# Patient Record
Sex: Male | Born: 1960 | Hispanic: Yes | Marital: Married | Smoking: Never smoker
Health system: Southern US, Community
[De-identification: ages and names within clinical notes are randomized; demographics above are authoritative.]

## PROBLEM LIST (undated history)

## (undated) ENCOUNTER — Ambulatory Visit (HOSPITAL_COMMUNITY): Admission: EM | Payer: BC Managed Care – PPO | Source: Home / Self Care

## (undated) DIAGNOSIS — E785 Hyperlipidemia, unspecified: Secondary | ICD-10-CM

## (undated) DIAGNOSIS — E119 Type 2 diabetes mellitus without complications: Secondary | ICD-10-CM

## (undated) DIAGNOSIS — I10 Essential (primary) hypertension: Secondary | ICD-10-CM

## (undated) DIAGNOSIS — L409 Psoriasis, unspecified: Secondary | ICD-10-CM

## (undated) HISTORY — PX: KNEE SURGERY: SHX244

---

## 2004-07-30 ENCOUNTER — Emergency Department (HOSPITAL_COMMUNITY): Admission: EM | Admit: 2004-07-30 | Discharge: 2004-07-30 | Payer: Self-pay | Admitting: Emergency Medicine

## 2004-09-25 ENCOUNTER — Emergency Department (HOSPITAL_COMMUNITY): Admission: EM | Admit: 2004-09-25 | Discharge: 2004-09-25 | Payer: Self-pay | Admitting: Emergency Medicine

## 2010-12-06 ENCOUNTER — Other Ambulatory Visit (HOSPITAL_COMMUNITY): Payer: Self-pay | Admitting: *Deleted

## 2010-12-06 ENCOUNTER — Emergency Department (HOSPITAL_COMMUNITY): Payer: Self-pay

## 2010-12-06 ENCOUNTER — Emergency Department (HOSPITAL_COMMUNITY)
Admission: EM | Admit: 2010-12-06 | Discharge: 2010-12-06 | Disposition: A | Discharge status: Home / Self Care | Payer: Self-pay | Attending: Emergency Medicine | Admitting: Emergency Medicine

## 2010-12-06 DIAGNOSIS — J029 Acute pharyngitis, unspecified: Secondary | ICD-10-CM | POA: Insufficient documentation

## 2010-12-06 DIAGNOSIS — R05 Cough: Secondary | ICD-10-CM | POA: Insufficient documentation

## 2010-12-06 DIAGNOSIS — J069 Acute upper respiratory infection, unspecified: Secondary | ICD-10-CM | POA: Insufficient documentation

## 2010-12-06 DIAGNOSIS — R059 Cough, unspecified: Secondary | ICD-10-CM | POA: Insufficient documentation

## 2012-05-31 IMAGING — CR DG CHEST 2V
2 series · 2 of 2 positions shown · non-contrast
Comparison: 07/30/2004

CLINICAL DATA: Cough

CHEST - 2 VIEW

[view not recorded (1 of 2)]
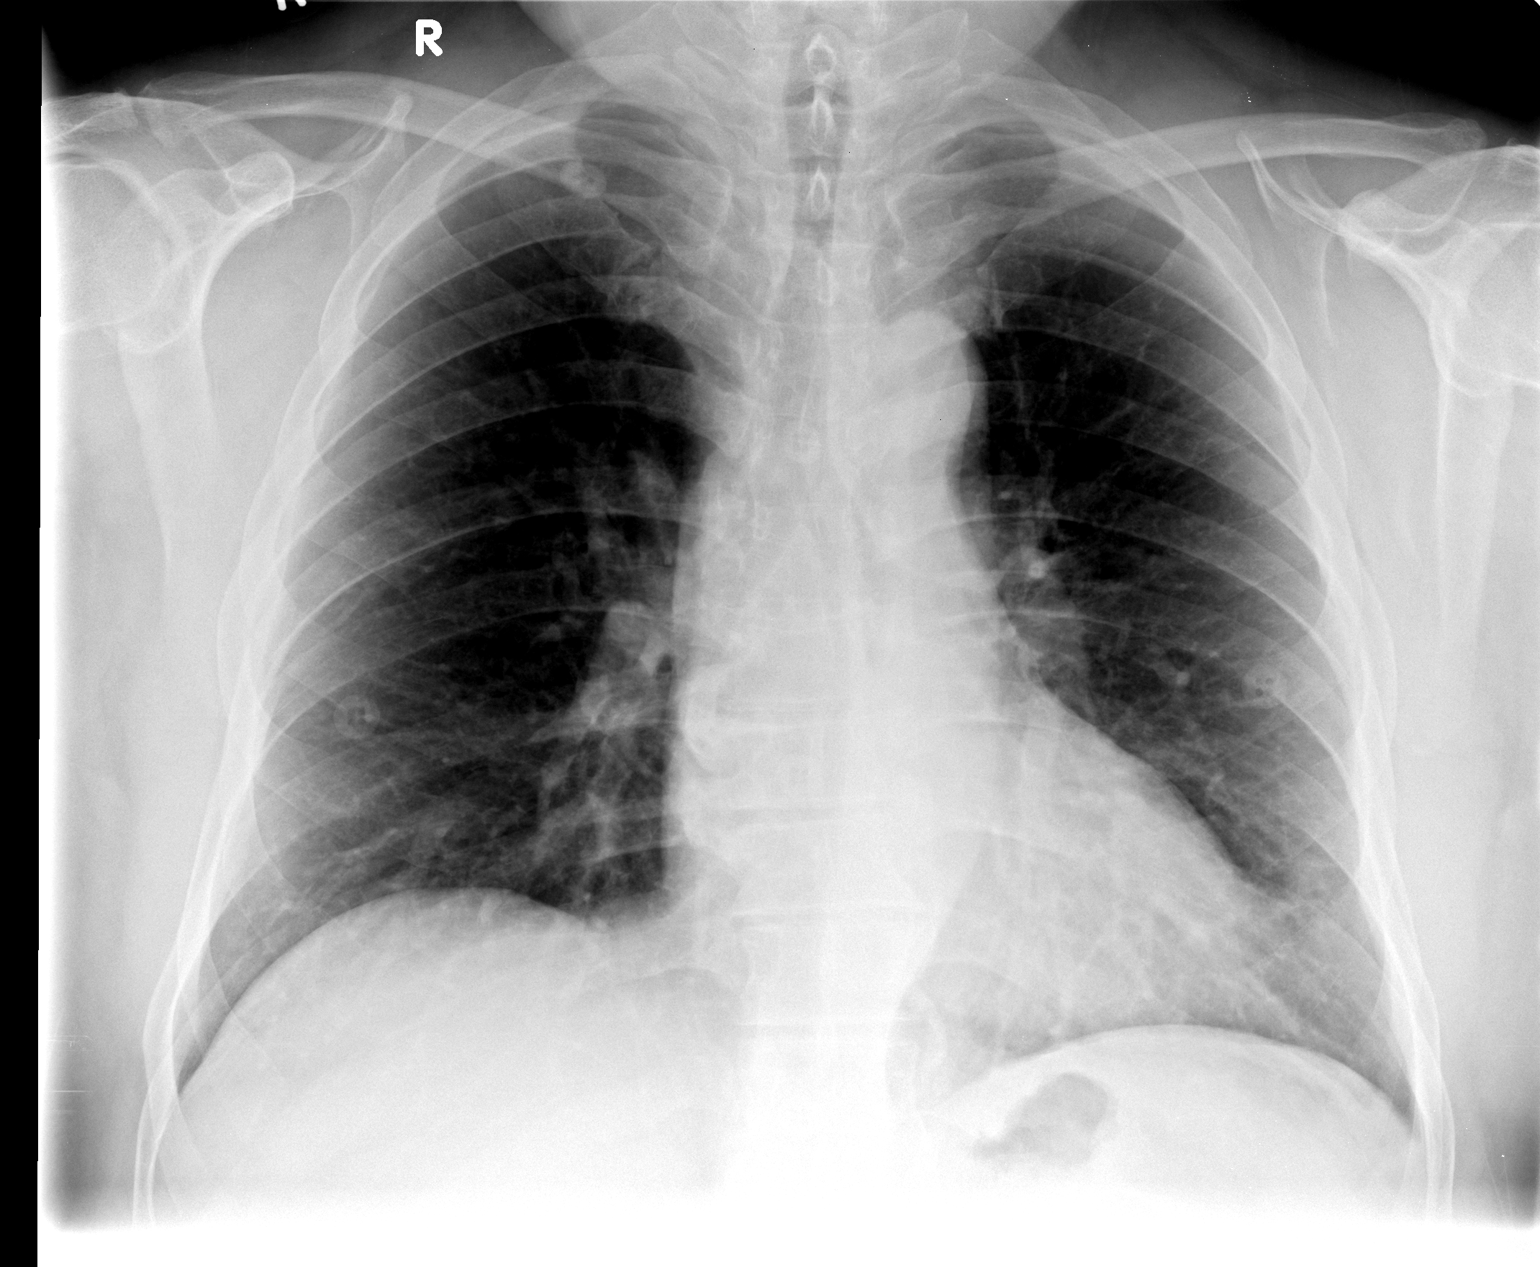

[view not recorded (2 of 2)]
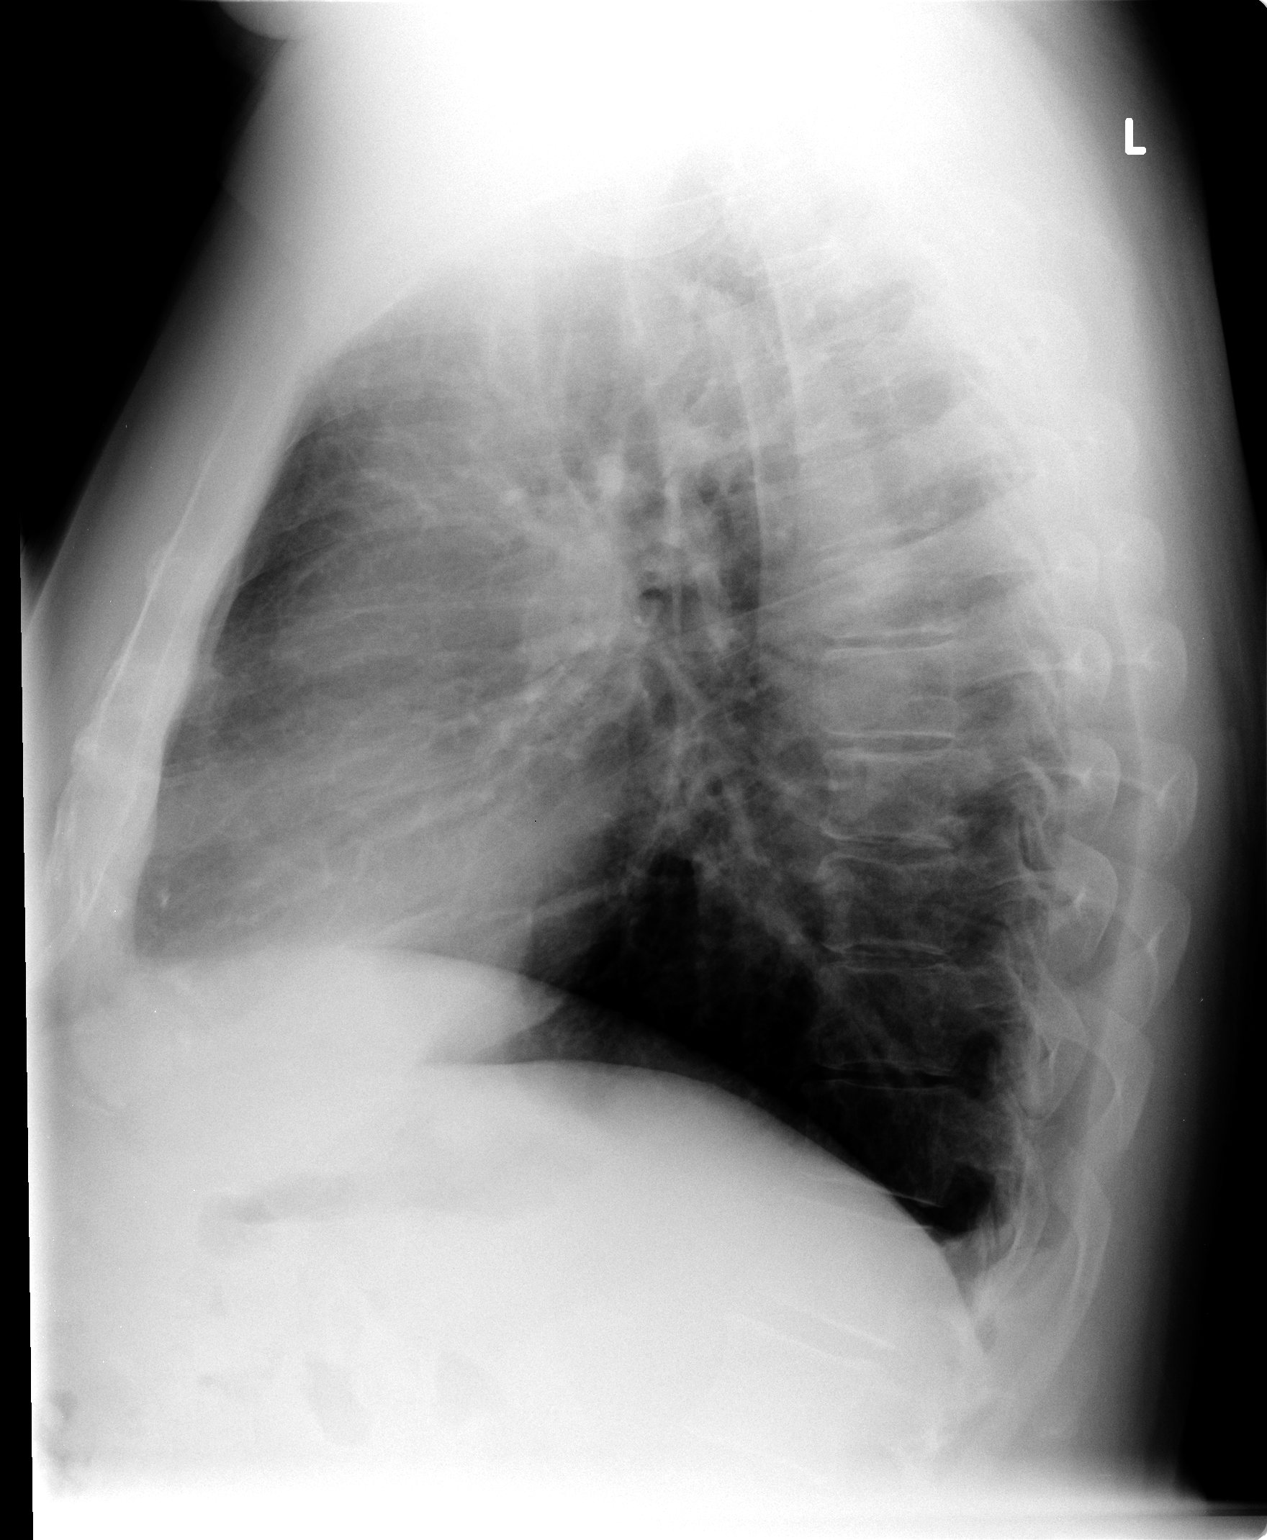

[2 of 2 positions shown; findings below may reference images not displayed]

FINDINGS: Heart size normal.  Aorta mildly tortuous.  Lungs
hyperaerated but clear.

Bilateral button artifacts project over the chest on the frontal
view.
IMPRESSION: No active disease.

## 2022-02-28 ENCOUNTER — Other Ambulatory Visit: Payer: Self-pay

## 2022-02-28 ENCOUNTER — Encounter (HOSPITAL_COMMUNITY): Payer: Self-pay | Admitting: Emergency Medicine

## 2022-02-28 ENCOUNTER — Emergency Department (HOSPITAL_COMMUNITY)
Admission: EM | Admit: 2022-02-28 | Discharge: 2022-02-28 | Disposition: A | Discharge status: Home / Self Care | Payer: BC Managed Care – PPO | Attending: Emergency Medicine | Admitting: Emergency Medicine

## 2022-02-28 DIAGNOSIS — R21 Rash and other nonspecific skin eruption: Secondary | ICD-10-CM | POA: Diagnosis not present

## 2022-02-28 DIAGNOSIS — E1165 Type 2 diabetes mellitus with hyperglycemia: Secondary | ICD-10-CM | POA: Diagnosis not present

## 2022-02-28 HISTORY — DX: Essential (primary) hypertension: I10

## 2022-02-28 HISTORY — DX: Hyperlipidemia, unspecified: E78.5

## 2022-02-28 LAB — CBC WITH DIFFERENTIAL/PLATELET
Abs Immature Granulocytes: 0.02 10*3/uL (ref 0.00–0.07)
Basophils Absolute: 0 10*3/uL (ref 0.0–0.1)
Basophils Relative: 1 %
Eosinophils Absolute: 0.2 10*3/uL (ref 0.0–0.5)
Eosinophils Relative: 2 %
HCT: 40.3 % (ref 39.0–52.0)
Hemoglobin: 14.1 g/dL (ref 13.0–17.0)
Immature Granulocytes: 0 %
Lymphocytes Relative: 20 %
Lymphs Abs: 1.3 10*3/uL (ref 0.7–4.0)
MCH: 32.1 pg (ref 26.0–34.0)
MCHC: 35 g/dL (ref 30.0–36.0)
MCV: 91.8 fL (ref 80.0–100.0)
Monocytes Absolute: 0.5 10*3/uL (ref 0.1–1.0)
Monocytes Relative: 7 %
Neutro Abs: 4.6 10*3/uL (ref 1.7–7.7)
Neutrophils Relative %: 70 %
Platelets: 271 10*3/uL (ref 150–400)
RBC: 4.39 MIL/uL (ref 4.22–5.81)
RDW: 12.4 % (ref 11.5–15.5)
WBC: 6.5 10*3/uL (ref 4.0–10.5)
nRBC: 0 % (ref 0.0–0.2)

## 2022-02-28 LAB — BASIC METABOLIC PANEL
Anion gap: 9 (ref 5–15)
BUN: 12 mg/dL (ref 6–20)
CO2: 28 mmol/L (ref 22–32)
Calcium: 9.1 mg/dL (ref 8.9–10.3)
Chloride: 98 mmol/L (ref 98–111)
Creatinine, Ser: 0.91 mg/dL (ref 0.61–1.24)
GFR, Estimated: >60 mL/min (ref >60)
Glucose, Bld: 144 mg/dL — ABNORMAL HIGH (ref 70–99)
Potassium: 3.7 mmol/L (ref 3.5–5.1)
Sodium: 135 mmol/L (ref 135–145)

## 2022-02-28 LAB — HIV ANTIBODY (ROUTINE TESTING W REFLEX): HIV Screen 4th Generation wRfx: NONREACTIVE

## 2022-02-28 MED ORDER — PREDNISONE 10 MG PO TABS: 30.0000 mg | ORAL_TABLET | Freq: Every day | ORAL | 0 refills | Status: AC

## 2022-02-28 MED ORDER — DIPHENHYDRAMINE HCL 25 MG PO CAPS
50.0000 mg | ORAL_CAPSULE | Freq: Once | ORAL | Status: AC
Start: 2022-02-28 — End: 2022-02-28
  Administered 2022-02-28: 50 mg via ORAL
  Filled 2022-02-28: qty 2

## 2022-02-28 MED ORDER — FAMOTIDINE 20 MG PO TABS
20.0000 mg | ORAL_TABLET | Freq: Once | ORAL | Status: AC
  Administered 2022-02-28: 20 mg via ORAL
  Filled 2022-02-28: qty 1

## 2022-02-28 MED ORDER — DOXYCYCLINE HYCLATE 100 MG PO CAPS: 100.0000 mg | ORAL_CAPSULE | Freq: Two times a day (BID) | ORAL | 0 refills | Status: AC

## 2022-02-28 NOTE — Discharge Instructions (Signed)
I have started you on antibiotics and steroids please take as prescribed.  Please follow-up with dermatology.  Come back to the emergency department if you develop chest pain, shortness of breath, severe abdominal pain, uncontrolled nausea, vomiting, diarrhea.

## 2022-02-28 NOTE — ED Provider Notes (Signed)
[facility] Provider Note   CSN: [id number] Arrival date & time: 02/28/22  1014     History  Chief Complaint  Patient presents with   Rash    Jose Summers is a 61 y.o. male.  HPI  With medical history including diabetes, presents with complaints of a rash.  Patient's rash started about 2 months ago, states initially was under his right armpit is in migrated all over his body, states the rash is itchy, will occasion become painful when he sweats in it, he denies any tongue throat lip swelling difficulty breathing no nausea or vomiting he denies systemic fevers chills general body aches.  He denies oral lesions or lesions or hands or feet, no penile discharge or testicular pain, denies any recent tick bites.  No new environmental changes or new antibiotics.  He states that he has had this in the past and was given a cream and it all went away.  He does note that the rash seems to become more inflamed and then decrease, typically during the day and then improves by the time he wakes up.  Home Medications Prior to Admission medications   Medication Sig Start Date End Date Taking? Authorizing Provider  doxycycline (VIBRAMYCIN) 100 MG capsule Take 1 capsule (100 mg total) by mouth 2 (two) times daily for 7 days. 02/28/22 03/07/22 Yes Marcello Fennel, PA-C  predniSONE (DELTASONE) 10 MG tablet Take 3 tablets (30 mg total) by mouth daily for 5 days. 02/28/22 03/05/22 Yes Marcello Fennel, PA-C      Allergies    Patient has no known allergies.    Review of Systems   Review of Systems  Constitutional:  Negative for chills and fever.  Respiratory:  Negative for shortness of breath.   Cardiovascular:  Negative for chest pain.  Gastrointestinal:  Negative for abdominal pain.  Skin:  Positive for rash.  Neurological:  Negative for headaches.   Physical Exam Updated Vital Signs BP (!) 157/111 (BP Location: Right Arm)   Pulse 70   Temp 97.9 F (36.6 C) (Oral)    Resp 16   Ht 5\' 6"  (1.676 m)   Wt 127 kg   SpO2 98%   BMI 45.19 kg/m  Physical Exam Vitals and nursing note reviewed.  Constitutional:      General: He is not in acute distress.    Appearance: He is not ill-appearing.  HENT:     Head: Normocephalic and atraumatic.     Nose: No congestion.     Mouth/Throat:     Mouth: Mucous membranes are moist.     Pharynx: Oropharynx is clear. No oropharyngeal exudate or posterior oropharyngeal erythema.     Comments: No oral lesions present.  No exudates or erythema noted around the tonsils. Eyes:     General: No scleral icterus.    Conjunctiva/sclera: Conjunctivae normal.  Cardiovascular:     Rate and Rhythm: Normal rate and regular rhythm.     Pulses: Normal pulses.     Heart sounds: No murmur heard.   No friction rub. No gallop.  Pulmonary:     Effort: No respiratory distress.     Breath sounds: No wheezing, rhonchi or rales.  Skin:    General: Skin is warm and dry.     Comments: Skin exam was performed he has noted papule like rash around his body, noted beneath his armpits on his arms back chest and legs, it spares the palms and the soles of the  feet, rash will blanch with pressure, some of the rash has scaling and scabbing on it.  Please see picture for full detail.  Neurological:     Mental Status: He is alert.  Psychiatric:        Mood and Affect: Mood normal.             ED Results / Procedures / Treatments   Labs (all labs ordered are listed, but only abnormal results are displayed) Labs Reviewed  BASIC METABOLIC PANEL - Abnormal; Notable for the following components:      Result Value   Glucose, Bld 144 (*)    All other components within normal limits  CBC WITH DIFFERENTIAL/PLATELET  RPR  HIV ANTIBODY (ROUTINE TESTING W REFLEX)  GC/CHLAMYDIA PROBE AMP ([place]) NOT AT [facility]    EKG None  Radiology No results found.  Procedures Procedures    Medications Ordered in ED Medications  diphenhydrAMINE  (BENADRYL) capsule 50 mg (50 mg Oral Given 02/28/22 1152)  famotidine (PEPCID) tablet 20 mg (20 mg Oral Given 02/28/22 1152)    ED Course/ Medical Decision Making/ A&P                           Medical Decision Making Amount and/or Complexity of Data Reviewed Labs: ordered.   This patient presents to the ED for concern of rash, this involves an extensive number of treatment options, and is a complaint that carries with it a high risk of complications and morbidity.  The differential diagnosis includes the [place] gonorrhea, HIV, syphilis, Stevens-Johnson's, Lyme's disease    Additional history obtained:  Additional history obtained from wife at bedside External records from outside source obtained and reviewed including N/A   Co morbidities that complicate the patient evaluation  Diabetes  Social Determinants of Health:  No primary care provider    Lab Tests:  I Ordered, and personally interpreted labs.  The pertinent results include: CBC unremarkable, BMP shows glucose of 144, HIV, syphilis GC chlamydia are all pending at this time.   Imaging Studies ordered:  I ordered imaging studies including N/A I independently visualized and interpreted imaging which showed N/A I agree with the radiologist interpretation   Cardiac Monitoring:  The patient was maintained on a cardiac monitor.  I personally viewed and interpreted the cardiac monitored which showed an underlying rhythm of: N/A   Medicines ordered and prescription drug management:  I ordered medication including H1 H2 blockers I have reviewed the patients home medicines and have made adjustments as needed  Critical Interventions:  N/A   Reevaluation:  Presents with a rash, unclear etiology, will obtain basic lab work-up provided prior with some Benadryl and reassess  Reassessed after H1 and H2 blockers rash appears to improved, but is still there, vital signs remained stable, he is agreement with  discharge and plan.  Consultations Obtained:  N/A    considered:  Admission-this will be deferred as this rashes been going on for last 2 months, he is hemodynamically stable, nontoxic-appearing, no significant derailments in lab work.  He has not attempted outpatient therapy will attempt and have him follow-up with dermatology.    Rule out Low suspicion for TENS or Stevens-Johnson's as there is no oral lesions, no skin sloughing.  I have low suspicion for Staphylococcus scalded skin syndrome is no peeling of the skin, patient has not been on any recent antibiotics.  I have low suspicion for Lyme disease [facility] spotted  fever, syphilis as presentation is atypical of etiology spares the soles and palms.  I have low suspicion for hand-foot-and-mouth presentation atypical.  Low suspicion for systemic infection as patient nontoxic-appearing vital signs are reassuring.    Dispostion and problem list  After consideration of the diagnostic results and the patients response to treatment, I feel that the patent would benefit from discharge.  Rash-unclear etiology but I suspect this is multifactorial, I suspect he has some suspicious dermatitis noted on his head, he may be suffering from atypical psoriasis.  I am going to start him a short course of steroids, and also start him on some antibiotics as he has been picking at the skin and concerned he might develop a overlying skin infection.  Will recommend follows up with dermatology.           Final Clinical Impression(s) / ED Diagnoses Final diagnoses:  Rash    Rx / DC Orders ED Discharge Orders          Ordered    predniSONE (DELTASONE) 10 MG tablet  Daily        02/28/22 1333    doxycycline (VIBRAMYCIN) 100 MG capsule  2 times daily        02/28/22 1333              Aron Baba 02/28/22 1333    Godfrey Pick, MD 03/05/22 [phone]

## 2022-02-28 NOTE — ED Triage Notes (Signed)
Pt to the ED with a rash on his entire body for the last 2 months. The rash in read in color and itchy.

## 2022-03-01 LAB — GC/CHLAMYDIA PROBE AMP (~~LOC~~) NOT AT ARMC
Chlamydia: NEGATIVE
Comment: NEGATIVE
Comment: NORMAL
Neisseria Gonorrhea: NEGATIVE

## 2022-03-01 LAB — RPR: RPR Ser Ql: NONREACTIVE

## 2023-07-13 ENCOUNTER — Encounter (HOSPITAL_COMMUNITY): Payer: Self-pay

## 2023-07-13 ENCOUNTER — Ambulatory Visit (HOSPITAL_COMMUNITY)
Admission: RE | Admit: 2023-07-13 | Discharge: 2023-07-13 | Disposition: A | Discharge status: Home / Self Care | Payer: BC Managed Care – PPO | Source: Ambulatory Visit

## 2023-07-13 VITALS — BP 143/90 | HR 77 | Temp 97.7°F | Resp 18 | Ht 66.0 in | Wt 280.0 lb

## 2023-07-13 DIAGNOSIS — L409 Psoriasis, unspecified: Secondary | ICD-10-CM

## 2023-07-13 HISTORY — DX: Psoriasis, unspecified: L40.9

## 2023-07-13 HISTORY — DX: Type 2 diabetes mellitus without complications: E11.9

## 2023-07-13 MED ORDER — PREDNISONE 20 MG PO TABS: 40.0000 mg | ORAL_TABLET | Freq: Every day | ORAL | 0 refills | Status: AC

## 2023-07-13 MED ORDER — HYDROXYZINE HCL 25 MG PO TABS: 25.0000 mg | ORAL_TABLET | Freq: Three times a day (TID) | ORAL | 0 refills | Status: DC | PRN

## 2023-07-13 MED ORDER — DEXAMETHASONE SODIUM PHOSPHATE 10 MG/ML IJ SOLN
INTRAMUSCULAR | Status: AC
  Filled 2023-07-13: qty 1

## 2023-07-13 MED ORDER — DEXAMETHASONE SODIUM PHOSPHATE 10 MG/ML IJ SOLN
10.0000 mg | Freq: Once | INTRAMUSCULAR | Status: AC
  Administered 2023-07-13: 10 mg via INTRAMUSCULAR

## 2023-07-13 NOTE — Discharge Instructions (Addendum)
Start taking prednisone tomorrow daily for 5 days.  Steroids can increase your blood sugar so please keep a close eye on these and try to eat a low-carb diet over the next few days.  You can take hydroxyzine as needed for itching but this can make you drowsy so do not take while driving or working.  Continue using moisturizing cream that you have been using at home.  I have attached a dermatologist by the name of Dr. Langston Reusing, please call them first thing Monday morning to set up an appointment regarding chronic treatment for psoriasis.  Return here as needed.

## 2023-07-13 NOTE — ED Provider Notes (Signed)
[facility]    CSN: [id number] Arrival date & time: 07/13/23  1039      History   Chief Complaint Chief Complaint  Patient presents with   Skin Problem    HPI Jose Summers is a 62 y.o. male.   Patient presents with a rash and skin irritation x 4 months.  History of psoriasis.  Reports using CeraVe and over-the-counter moisturizers with minimal relief.     Past Medical History:  Diagnosis Date   Diabetes mellitus without complication (HCC)    Hyperlipidemia    Hypertension    Psoriasis     There are no problems to display for this patient.   Past Surgical History:  Procedure Laterality Date   KNEE SURGERY Right        Home Medications    Prior to Admission medications   Medication Sig Start Date End Date Taking? Authorizing Provider  atorvastatin (LIPITOR) 40 MG tablet TAKE 1 TABLET EVERY DAY BY MOUTH AT BEDTIME FOR 90 DAYS.   Yes [provider]  hydrOXYzine (ATARAX) 25 MG tablet Take 1 tablet (25 mg total) by mouth every 8 (eight) hours as needed for itching. 07/13/23  Yes Angelik Walls A, NP  lisinopril-hydrochlorothiazide (ZESTORETIC) 20-12.5 MG tablet TAKE 2 TABLETS EVERY DAY BY ORAL ROUTE FOR 90 DAYS.   Yes [provider]  metFORMIN (GLUCOPHAGE) 850 MG tablet TAKE 1 TABLET TWICE A DAY BY MOUTH WITH MEALS FOR 90 DAYS.   Yes [provider]  predniSONE (DELTASONE) 20 MG tablet Take 2 tablets (40 mg total) by mouth daily for 5 days. 07/13/23 07/18/23 Yes Letta Kocher, NP    Family History History reviewed. No pertinent family history.  Social History Social History   Tobacco Use   Smoking status: Never    Passive exposure: Never   Smokeless tobacco: Never  Vaping Use   Vaping status: Never Used  Substance Use Topics   Alcohol use: Not Currently   Drug use: Not Currently     Allergies   Patient has no known allergies.   Review of Systems Review of Systems  Constitutional:  Negative  for chills, fatigue and fever.  Skin:  Positive for color change and rash. Negative for pallor.     Physical Exam Triage Vital Signs ED Triage Vitals  Encounter Vitals Group     BP 07/13/23 1059 (!) 143/90     Systolic BP Percentile --      Diastolic BP Percentile --      Pulse Rate 07/13/23 1059 77     Resp 07/13/23 1059 18     Temp 07/13/23 1059 97.7 F (36.5 C)     Temp Source 07/13/23 1059 Oral     SpO2 07/13/23 1059 96 %     Weight 07/13/23 1058 279 lb 15.8 oz (127 kg)     Height 07/13/23 1058 5\' 6"  (1.676 m)     Head Circumference --      Peak Flow --      Pain Score 07/13/23 1057 5     Pain Loc --      Pain Education --      Exclude from Growth Chart --    No data found.  Updated Vital Signs BP (!) 143/90 (BP Location: Left Arm)   Pulse 77   Temp 97.7 F (36.5 C) (Oral)   Resp 18   Ht 5\' 6"  (1.676 m)   Wt 279 lb 15.8 oz (127 kg)  SpO2 96%   BMI 45.19 kg/m   Visual Acuity Right Eye Distance:   Left Eye Distance:   Bilateral Distance:    Right Eye Near:   Left Eye Near:    Bilateral Near:     Physical Exam Vitals and nursing note reviewed.  Constitutional:      General: He is awake. He is not in acute distress.    Appearance: Normal appearance. He is well-developed and well-groomed. He is not ill-appearing, toxic-appearing or diaphoretic.  Skin:    General: Skin is warm and dry.     Findings: Erythema, lesion and rash present.     Comments: Multiple areas of erythematous plaques noted to scalp, chest, back, and upper extremities as pictured.  Neurological:     Mental Status: He is alert.  Psychiatric:        Behavior: Behavior is cooperative.                 UC Treatments / Results  Labs (all labs ordered are listed, but only abnormal results are displayed) Labs Reviewed - No data to display  EKG   Radiology No results found.  Procedures Procedures (including critical care time)  Medications Ordered in UC Medications   dexamethasone (DECADRON) injection 10 mg (10 mg Intramuscular Given 07/13/23 1134)    Initial Impression / Assessment and Plan / UC Course  I have reviewed the triage vital signs and the nursing notes.  Pertinent labs & imaging results that were available during my care of the patient were reviewed by me and considered in my medical decision making (see chart for details).     Patient presented with itchy rash and skin irritation x 4 months.  History of psoriasis. Multiple areas of erythematous plaques noted to scalp, chest, back, and upper extremities as pictured above.  Per daughter blood sugars are well-controlled with metformin and states the highest reading in the 140s. Discussed keeping a close eye on blood sugars over the next few days while taking steroids.   Given IM Decadron in clinic. Prescribed 5-day prednisone burst. Prescribed hydroxyzine as needed for itching. Discussed follow-up and return precautions. Final Clinical Impressions(s) / UC Diagnoses   Final diagnoses:  Exacerbation of psoriasis     Discharge Instructions      Start taking prednisone tomorrow daily for 5 days.  Steroids can increase your blood sugar so please keep a close eye on these and try to eat a low-carb diet over the next few days.  You can take hydroxyzine as needed for itching but this can make you drowsy so do not take while driving or working.  Continue using moisturizing cream that you have been using at home.  I have attached a dermatologist by the name of Dr. Langston Reusing, please call them first thing Monday morning to set up an appointment regarding chronic treatment for psoriasis.  Return here as needed.     ED Prescriptions     Medication Sig Dispense Auth. Provider   predniSONE (DELTASONE) 20 MG tablet Take 2 tablets (40 mg total) by mouth daily for 5 days. 10 tablet Wynonia Lawman A, NP   hydrOXYzine (ATARAX) 25 MG tablet Take 1 tablet (25 mg total) by mouth every 8 (eight) hours  as needed for itching. 12 tablet Wynonia Lawman A, NP      PDMP not reviewed this encounter.   Wynonia Lawman A, NP 07/13/23 1239

## 2023-07-13 NOTE — ED Triage Notes (Signed)
Skin Irritataion X 4 Months. Patient states he has psoriasis that is flaring up.

## 2023-09-22 ENCOUNTER — Observation Stay (HOSPITAL_COMMUNITY)
Admission: EM | Admit: 2023-09-22 | Discharge: 2023-09-25 | Disposition: A | Discharge status: Home / Self Care | Payer: BC Managed Care – PPO | Attending: Family Medicine | Admitting: Family Medicine

## 2023-09-22 ENCOUNTER — Encounter (HOSPITAL_COMMUNITY): Payer: Self-pay | Admitting: Emergency Medicine

## 2023-09-22 ENCOUNTER — Other Ambulatory Visit: Payer: Self-pay

## 2023-09-22 ENCOUNTER — Emergency Department (HOSPITAL_COMMUNITY): Payer: BC Managed Care – PPO

## 2023-09-22 DIAGNOSIS — I10 Essential (primary) hypertension: Secondary | ICD-10-CM | POA: Diagnosis not present

## 2023-09-22 DIAGNOSIS — E119 Type 2 diabetes mellitus without complications: Secondary | ICD-10-CM | POA: Insufficient documentation

## 2023-09-22 DIAGNOSIS — E162 Hypoglycemia, unspecified: Principal | ICD-10-CM

## 2023-09-22 DIAGNOSIS — Z79899 Other long term (current) drug therapy: Secondary | ICD-10-CM | POA: Insufficient documentation

## 2023-09-22 DIAGNOSIS — E782 Mixed hyperlipidemia: Secondary | ICD-10-CM | POA: Diagnosis not present

## 2023-09-22 DIAGNOSIS — Z6831 Body mass index (BMI) 31.0-31.9, adult: Secondary | ICD-10-CM | POA: Diagnosis not present

## 2023-09-22 DIAGNOSIS — R627 Adult failure to thrive: Secondary | ICD-10-CM | POA: Insufficient documentation

## 2023-09-22 DIAGNOSIS — Z7984 Long term (current) use of oral hypoglycemic drugs: Secondary | ICD-10-CM | POA: Diagnosis not present

## 2023-09-22 DIAGNOSIS — E669 Obesity, unspecified: Secondary | ICD-10-CM | POA: Diagnosis not present

## 2023-09-22 DIAGNOSIS — E11649 Type 2 diabetes mellitus with hypoglycemia without coma: Principal | ICD-10-CM | POA: Insufficient documentation

## 2023-09-22 DIAGNOSIS — E871 Hypo-osmolality and hyponatremia: Secondary | ICD-10-CM | POA: Insufficient documentation

## 2023-09-22 LAB — COMPREHENSIVE METABOLIC PANEL
ALT: 19 U/L (ref 0–44)
AST: 43 U/L — ABNORMAL HIGH (ref 15–41)
Albumin: 4 g/dL (ref 3.5–5.0)
Alkaline Phosphatase: 64 U/L (ref 38–126)
Anion gap: 14 (ref 5–15)
BUN: 9 mg/dL (ref 8–23)
CO2: 23 mmol/L (ref 22–32)
Calcium: 9.1 mg/dL (ref 8.9–10.3)
Chloride: 91 mmol/L — ABNORMAL LOW (ref 98–111)
Creatinine, Ser: 0.93 mg/dL (ref 0.61–1.24)
GFR, Estimated: >60 mL/min (ref >60)
Glucose, Bld: 33 mg/dL — CL (ref 70–99)
Potassium: 3.8 mmol/L (ref 3.5–5.1)
Sodium: 128 mmol/L — ABNORMAL LOW (ref 135–145)
Total Bilirubin: 0.7 mg/dL (ref <1.2)
Total Protein: 7.5 g/dL (ref 6.5–8.1)

## 2023-09-22 LAB — CBG MONITORING, ED
Glucose-Capillary: 27 mg/dL — CL (ref 70–99)
Glucose-Capillary: <10 mg/dL — CL (ref 70–99)
Glucose-Capillary: 48 mg/dL — ABNORMAL LOW (ref 70–99)
Glucose-Capillary: 83 mg/dL (ref 70–99)

## 2023-09-22 LAB — CBC WITH DIFFERENTIAL/PLATELET
Abs Immature Granulocytes: 0.04 10*3/uL (ref 0.00–0.07)
Basophils Absolute: 0.1 10*3/uL (ref 0.0–0.1)
Basophils Relative: 0 %
Eosinophils Absolute: 0.1 10*3/uL (ref 0.0–0.5)
Eosinophils Relative: 1 %
HCT: 38.8 % — ABNORMAL LOW (ref 39.0–52.0)
Hemoglobin: 13.8 g/dL (ref 13.0–17.0)
Immature Granulocytes: 0 %
Lymphocytes Relative: 8 %
Lymphs Abs: 1 10*3/uL (ref 0.7–4.0)
MCH: 32.5 pg (ref 26.0–34.0)
MCHC: 35.6 g/dL (ref 30.0–36.0)
MCV: 91.5 fL (ref 80.0–100.0)
Monocytes Absolute: 0.7 10*3/uL (ref 0.1–1.0)
Monocytes Relative: 6 %
Neutro Abs: 9.5 10*3/uL — ABNORMAL HIGH (ref 1.7–7.7)
Neutrophils Relative %: 85 %
Platelets: 320 10*3/uL (ref 150–400)
RBC: 4.24 MIL/uL (ref 4.22–5.81)
RDW: 13.2 % (ref 11.5–15.5)
WBC: 11.3 10*3/uL — ABNORMAL HIGH (ref 4.0–10.5)
nRBC: 0 % (ref 0.0–0.2)

## 2023-09-22 LAB — TSH: TSH: 7.402 u[IU]/mL — ABNORMAL HIGH (ref 0.350–4.500)

## 2023-09-22 LAB — TROPONIN I (HIGH SENSITIVITY): Troponin I (High Sensitivity): 4 ng/L (ref <18)

## 2023-09-22 LAB — MAGNESIUM: Magnesium: 1.2 mg/dL — ABNORMAL LOW (ref 1.7–2.4)

## 2023-09-22 MED ORDER — MAGNESIUM SULFATE 2 GM/50ML IV SOLN
2.0000 g | Freq: Once | INTRAVENOUS | Status: AC
  Administered 2023-09-23: 2 g via INTRAVENOUS
  Filled 2023-09-22: qty 50

## 2023-09-22 MED ORDER — DEXTROSE 10 % IV SOLN: Freq: Once | INTRAVENOUS | Status: AC

## 2023-09-22 MED ORDER — GLUCOSE 40 % PO GEL
1.0000 | Freq: Once | ORAL | Status: AC
  Administered 2023-09-22: 31 g via ORAL

## 2023-09-22 MED ORDER — GLUCOSE 40 % PO GEL
ORAL | Status: AC
  Filled 2023-09-22: qty 1.21

## 2023-09-22 MED ORDER — DEXTROSE 50 % IV SOLN
1.0000 | Freq: Once | INTRAVENOUS | Status: AC
  Administered 2023-09-22: 50 mL via INTRAVENOUS
  Filled 2023-09-22: qty 50

## 2023-09-22 MED ORDER — DEXTROSE 50 % IV SOLN
25.0000 g | Freq: Once | INTRAVENOUS | Status: AC
  Administered 2023-09-22: 25 g via INTRAVENOUS

## 2023-09-22 NOTE — ED Triage Notes (Signed)
Pt has not been acting right per family and blood sugar in 30s. Pt had fall yesterday.

## 2023-09-22 NOTE — ED Notes (Signed)
ED Provider at bedside. 

## 2023-09-22 NOTE — ED Provider Notes (Signed)
[place] EMERGENCY DEPARTMENT AT [facility] Provider Note   CSN: [id number] Arrival date & time: 09/22/23  2134     History  Chief Complaint  Patient presents with   Hypoglycemia    Jose Summers is a 62 y.o. male.  The history is provided by the patient, the spouse and a relative. The history is limited by a language barrier. A language interpreter was used.  Hypoglycemia Associated symptoms: sweats and weakness    Patient presents with daughter and wife complaining of hypoglycemia.  Previous medical history of psoriasis, diabetes.  Patient is uncertain as to what medications he is currently taking.  Patient states that he had not eaten much today.  Daughter states that he has been shaky for the last 2 months.  Endorses dizziness upon standing, difficulty ambulating which he states is due to his painful knees.  Last seen by PCP for psoriasis given prednisone and Decadron due to the extensive nature of the psoriasis.  Follow-up with dermatology is already scheduled.    Home Medications Prior to Admission medications   Medication Sig Start Date End Date Taking? Authorizing Provider  atorvastatin (LIPITOR) 40 MG tablet TAKE 1 TABLET EVERY DAY BY MOUTH AT BEDTIME FOR 90 DAYS.    [provider]  hydrOXYzine (ATARAX) 25 MG tablet Take 1 tablet (25 mg total) by mouth every 8 (eight) hours as needed for itching. 07/13/23   Wynonia Lawman A, NP  lisinopril-hydrochlorothiazide (ZESTORETIC) 20-12.5 MG tablet TAKE 2 TABLETS EVERY DAY BY ORAL ROUTE FOR 90 DAYS.    [provider]  metFORMIN (GLUCOPHAGE) 850 MG tablet TAKE 1 TABLET TWICE A DAY BY MOUTH WITH MEALS FOR 90 DAYS.    [provider]      Allergies    Patient has no known allergies.    Review of Systems   Review of Systems  Constitutional:  Positive for diaphoresis.  Neurological:  Positive for weakness.  All other systems reviewed and are negative.   Physical Exam Updated Vital  Signs BP 134/81   Pulse 88   Temp 98.3 F (36.8 C)   Resp 18   Ht 5\' 6"  (1.676 m)   Wt 124 kg   SpO2 96%   BMI 44.12 kg/m  Physical Exam Vitals and nursing note reviewed.  Constitutional:      Appearance: Normal appearance.  HENT:     Head: Normocephalic and atraumatic.  Eyes:     Extraocular Movements: Extraocular movements intact.     Conjunctiva/sclera: Conjunctivae normal.  Cardiovascular:     Rate and Rhythm: Normal rate and regular rhythm.     Pulses: Normal pulses.     Heart sounds: Normal heart sounds. No murmur heard.    No friction rub. No gallop.  Pulmonary:     Effort: Pulmonary effort is normal. No respiratory distress.     Breath sounds: Normal breath sounds.  Abdominal:     General: Abdomen is flat. There is distension.     Palpations: Abdomen is soft.     Tenderness: There is no abdominal tenderness.  Skin:    General: Skin is warm and dry.  Neurological:     General: No focal deficit present.     Mental Status: He is alert. Mental status is at baseline.  Psychiatric:        Mood and Affect: Mood normal.     ED Results / Procedures / Treatments   Labs (all labs ordered are listed, but only abnormal  results are displayed) Labs Reviewed  CBC WITH DIFFERENTIAL/PLATELET - Abnormal; Notable for the following components:      Result Value   WBC 11.3 (*)    HCT 38.8 (*)    Neutro Abs 9.5 (*)    All other components within normal limits  TSH - Abnormal; Notable for the following components:   TSH 7.402 (*)    All other components within normal limits  COMPREHENSIVE METABOLIC PANEL - Abnormal; Notable for the following components:   Sodium 128 (*)    Chloride 91 (*)    Glucose, Bld 33 (*)    AST 43 (*)    All other components within normal limits  MAGNESIUM - Abnormal; Notable for the following components:   Magnesium 1.2 (*)    All other components within normal limits  CBG MONITORING, ED - Abnormal; Notable for the following components:    Glucose-Capillary 27 (*)    All other components within normal limits  CBG MONITORING, ED - Abnormal; Notable for the following components:   Glucose-Capillary <10 (*)    All other components within normal limits  CBG MONITORING, ED - Abnormal; Notable for the following components:   Glucose-Capillary 48 (*)    All other components within normal limits  HEMOGLOBIN A1C  URINALYSIS, ROUTINE W REFLEX MICROSCOPIC  T4, FREE  CORTISOL  CBG MONITORING, ED  TROPONIN I (HIGH SENSITIVITY)    EKG EKG Interpretation Date/Time:  Sunday September 22 2023 22:36:43 EST Ventricular Rate:  87 PR Interval:  169 QRS Duration:  100 QT Interval:  363 QTC Calculation: 437 R Axis:   9  Text Interpretation: Sinus rhythm Low voltage, precordial leads Confirmed by Eber Hong ([id number]) on 09/22/2023 10:38:23 PM  Radiology No results found.  Procedures .Critical Care  Performed by: Lunette Stands, PA-C Authorized by: Lunette Stands, PA-C   Critical care provider statement:    Critical care time (minutes):  30   Critical care time was exclusive of:  Separately billable procedures and treating other patients   Critical care was necessary to treat or prevent imminent or life-threatening deterioration of the following conditions:  Endocrine crisis   Critical care was time spent personally by me on the following activities:  Development of treatment plan with patient or surrogate, discussions with consultants, evaluation of patient's response to treatment, examination of patient, ordering and review of laboratory studies, ordering and review of radiographic studies, ordering and performing treatments and interventions, pulse oximetry, re-evaluation of patient's condition, review of old charts and obtaining history from patient or surrogate   I assumed direction of critical care for this patient from another provider in my specialty: yes       Medications Ordered in ED Medications  magnesium sulfate  IVPB 2 g 50 mL (has no administration in time range)  dextrose (GLUTOSE) oral gel 40% (31 g Oral Given 09/22/23 2155)  dextrose 50 % solution 25 g (25 g Intravenous Given 09/22/23 2210)  dextrose 50 % solution 50 mL (50 mLs Intravenous Given 09/22/23 2330)  dextrose 10 % infusion ( Intravenous New Bag/Given 09/22/23 2333)    ED Course/ Medical Decision Making/ A&P                                 Medical Decision Making Amount and/or Complexity of Data Reviewed Labs: ordered. ECG/medicine tests: ordered.  Risk OTC drugs. Prescription drug management.   This patient is a  62 year old male who presents to the ED for concern of hypoglycemia.   Differential diagnoses prior to evaluation: The emergent differential diagnosis includes, but is not limited to, hepatic failure, renal failure, adrenal insufficiency, drug-induced hypoglycemia, systemic infection, hypothyroidism, hyperthyroidism, tumor. This is not an exhaustive differential.   Past Medical History / Co-morbidities / Social History: Diabetes, psoriasis, hypertension, hyperlipidemia  Additional history: Chart reviewed. Pertinent results include: Scheduled to have an upcoming appointment with dermatology.  However limited information regarding current medications.  Lab Tests/Imaging studies: I personally interpreted labs/imaging and the pertinent results include:   CBC shows elevated white count at 11.3   CMP shows decreased sodium 128  Magnesium 1.2  TSH 7.402  T4 pending  Cortisol pending  UA pending  Hemoglobin A1c pending  Chest x-ray pending  Cardiac monitoring: EKG obtained and interpreted by myself and attending physician which shows: Sinus rhythm   Medications: I ordered medication including dextrose, oral glucose.  I have reviewed the patients home medicines and have made adjustments as needed.  Critical Interventions: Dextrose, or glucose were provided for hypoglycemia   ED Course:  Patient is  a 62 year old male who presents to the ED for hyperglycemia.  Initial reading was low, next BG was 37.  He was provided oral glucose, dextrose, sandwich.  Glucose began rising.  He is also complaining of right rib pain after he fell and hit the side of a counter yesterday.  Daughter states that he has been especially shaky for the last 2 months and dizzy upon standing.  Patient agreed with assessment.  Physical exam showed slight abdominal distention which patient said is normal for him.  Physical exam otherwise benign.  Labs currently resulted include TSH, magnesium showing elevated TSH and decreased magnesium.  Labs still pending.  Patient will be undergoing continuous glucose monitoring.  Patient care was then transferred over to Dr. Blinda Leatherwood.    Disposition: 11:39 PM Care of @PATIENTNAME @ transferred to  Dr. Blinda Leatherwood at the end of my shift as the patient will require reassessment once labs/imaging have resulted. Patient presentation, ED course, and plan of care discussed with review of all pertinent labs and imaging. Please see his/her note for further details regarding further ED course and disposition. Plan at time of handoff is reevaluate medications from daughter who is bringing them from home, reevaluate labs and possible admission for cause of hypoglycemia, continue glucose monitoring, if patient remains stable and labs are normal patient can go home with close follow-up with PCP. This may be altered or completely changed at the discretion of the oncoming team pending results of further workup.    Final Clinical Impression(s) / ED Diagnoses Final diagnoses:  Hypoglycemia    Rx / DC Orders ED Discharge Orders     None         Lavonia Drafts 09/22/23 2339    Eber Hong, MD 09/23/23 [phone]

## 2023-09-23 ENCOUNTER — Encounter (HOSPITAL_COMMUNITY): Payer: Self-pay | Admitting: Internal Medicine

## 2023-09-23 DIAGNOSIS — I10 Essential (primary) hypertension: Secondary | ICD-10-CM | POA: Diagnosis not present

## 2023-09-23 DIAGNOSIS — E11649 Type 2 diabetes mellitus with hypoglycemia without coma: Principal | ICD-10-CM

## 2023-09-23 DIAGNOSIS — R627 Adult failure to thrive: Secondary | ICD-10-CM

## 2023-09-23 DIAGNOSIS — E669 Obesity, unspecified: Secondary | ICD-10-CM

## 2023-09-23 DIAGNOSIS — E871 Hypo-osmolality and hyponatremia: Secondary | ICD-10-CM

## 2023-09-23 DIAGNOSIS — E782 Mixed hyperlipidemia: Secondary | ICD-10-CM

## 2023-09-23 LAB — GLUCOSE, CAPILLARY
Glucose-Capillary: 120 mg/dL — ABNORMAL HIGH (ref 70–99)
Glucose-Capillary: 100 mg/dL — ABNORMAL HIGH (ref 70–99)
Glucose-Capillary: 59 mg/dL — ABNORMAL LOW (ref 70–99)
Glucose-Capillary: 111 mg/dL — ABNORMAL HIGH (ref 70–99)
Glucose-Capillary: 59 mg/dL — ABNORMAL LOW (ref 70–99)
Glucose-Capillary: 110 mg/dL — ABNORMAL HIGH (ref 70–99)
Glucose-Capillary: 64 mg/dL — ABNORMAL LOW (ref 70–99)
Glucose-Capillary: 124 mg/dL — ABNORMAL HIGH (ref 70–99)
Glucose-Capillary: 155 mg/dL — ABNORMAL HIGH (ref 70–99)
Glucose-Capillary: 166 mg/dL — ABNORMAL HIGH (ref 70–99)
Glucose-Capillary: 229 mg/dL — ABNORMAL HIGH (ref 70–99)
Glucose-Capillary: 234 mg/dL — ABNORMAL HIGH (ref 70–99)

## 2023-09-23 LAB — T4, FREE: Free T4: 0.69 ng/dL (ref 0.61–1.12)

## 2023-09-23 LAB — OSMOLALITY: Osmolality: 271 mosm/kg — ABNORMAL LOW (ref 275–295)

## 2023-09-23 LAB — COMPREHENSIVE METABOLIC PANEL
ALT: 17 U/L (ref 0–44)
AST: 32 U/L (ref 15–41)
Albumin: 3.1 g/dL — ABNORMAL LOW (ref 3.5–5.0)
Alkaline Phosphatase: 55 U/L (ref 38–126)
Anion gap: 8 (ref 5–15)
BUN: 9 mg/dL (ref 8–23)
CO2: 25 mmol/L (ref 22–32)
Calcium: 8.3 mg/dL — ABNORMAL LOW (ref 8.9–10.3)
Chloride: 93 mmol/L — ABNORMAL LOW (ref 98–111)
Creatinine, Ser: 0.85 mg/dL (ref 0.61–1.24)
GFR, Estimated: >60 mL/min (ref >60)
Glucose, Bld: 115 mg/dL — ABNORMAL HIGH (ref 70–99)
Potassium: 3.8 mmol/L (ref 3.5–5.1)
Sodium: 126 mmol/L — ABNORMAL LOW (ref 135–145)
Total Bilirubin: 0.5 mg/dL (ref <1.2)
Total Protein: 5.9 g/dL — ABNORMAL LOW (ref 6.5–8.1)

## 2023-09-23 LAB — CBC
HCT: 36.2 % — ABNORMAL LOW (ref 39.0–52.0)
Hemoglobin: 12.6 g/dL — ABNORMAL LOW (ref 13.0–17.0)
MCH: 31.8 pg (ref 26.0–34.0)
MCHC: 34.8 g/dL (ref 30.0–36.0)
MCV: 91.4 fL (ref 80.0–100.0)
Platelets: 278 10*3/uL (ref 150–400)
RBC: 3.96 MIL/uL — ABNORMAL LOW (ref 4.22–5.81)
RDW: 13.1 % (ref 11.5–15.5)
WBC: 8.7 10*3/uL (ref 4.0–10.5)
nRBC: 0 % (ref 0.0–0.2)

## 2023-09-23 LAB — PHOSPHORUS: Phosphorus: 2.6 mg/dL (ref 2.5–4.6)

## 2023-09-23 LAB — MRSA NEXT GEN BY PCR, NASAL: MRSA by PCR Next Gen: NOT DETECTED

## 2023-09-23 LAB — TROPONIN I (HIGH SENSITIVITY): Troponin I (High Sensitivity): 4 ng/L (ref <18)

## 2023-09-23 LAB — CORTISOL: Cortisol, Plasma: 8.5 ug/dL

## 2023-09-23 LAB — CBG MONITORING, ED: Glucose-Capillary: 79 mg/dL (ref 70–99)

## 2023-09-23 LAB — HIV ANTIBODY (ROUTINE TESTING W REFLEX): HIV Screen 4th Generation wRfx: NONREACTIVE

## 2023-09-23 LAB — MAGNESIUM: Magnesium: 1.9 mg/dL (ref 1.7–2.4)

## 2023-09-23 MED ORDER — DEXTROSE 50 % IV SOLN: 50.0000 mL | INTRAVENOUS | Status: DC | PRN

## 2023-09-23 MED ORDER — ATORVASTATIN CALCIUM 40 MG PO TABS
40.0000 mg | ORAL_TABLET | Freq: Every day | ORAL | Status: DC
  Administered 2023-09-23 – 2023-09-25 (×3): 40 mg via ORAL
  Filled 2023-09-23 (×3): qty 1

## 2023-09-23 MED ORDER — ONDANSETRON HCL 4 MG/2ML IJ SOLN
4.0000 mg | Freq: Four times a day (QID) | INTRAMUSCULAR | Status: DC | PRN
Start: 2023-09-23 — End: 2023-09-25

## 2023-09-23 MED ORDER — GLUCERNA SHAKE PO LIQD
237.0000 mL | Freq: Three times a day (TID) | ORAL | Status: DC
  Administered 2023-09-23 (×3): 237 mL via ORAL

## 2023-09-23 MED ORDER — ENOXAPARIN SODIUM 60 MG/0.6ML IJ SOSY
50.0000 mg | PREFILLED_SYRINGE | INTRAMUSCULAR | Status: DC
  Administered 2023-09-23: 50 mg via SUBCUTANEOUS
  Filled 2023-09-23: qty 0.6

## 2023-09-23 MED ORDER — DEXTROSE 50 % IV SOLN
INTRAVENOUS | Status: AC
  Administered 2023-09-23: 50 mL via INTRAVENOUS
  Filled 2023-09-23: qty 50

## 2023-09-23 MED ORDER — LISINOPRIL 10 MG PO TABS
40.0000 mg | ORAL_TABLET | Freq: Every day | ORAL | Status: DC
  Administered 2023-09-24 – 2023-09-25 (×2): 40 mg via ORAL
  Filled 2023-09-23 (×2): qty 4

## 2023-09-23 MED ORDER — ACETAMINOPHEN 325 MG PO TABS
650.0000 mg | ORAL_TABLET | Freq: Four times a day (QID) | ORAL | Status: DC | PRN
  Administered 2023-09-23 – 2023-09-24 (×2): 650 mg via ORAL
  Filled 2023-09-23 (×2): qty 2

## 2023-09-23 MED ORDER — SODIUM CHLORIDE 0.9 % IV SOLN: Freq: Once | INTRAVENOUS | Status: AC

## 2023-09-23 MED ORDER — IBUPROFEN 400 MG PO TABS
400.0000 mg | ORAL_TABLET | Freq: Four times a day (QID) | ORAL | Status: DC | PRN
  Administered 2023-09-23 – 2023-09-24 (×2): 400 mg via ORAL
  Filled 2023-09-23 (×2): qty 1

## 2023-09-23 MED ORDER — HYDROXYZINE HCL 25 MG PO TABS: 25.0000 mg | ORAL_TABLET | Freq: Three times a day (TID) | ORAL | Status: DC | PRN

## 2023-09-23 MED ORDER — DEXTROSE 50 % IV SOLN
50.0000 mL | Freq: Once | INTRAVENOUS | Status: AC
Start: 2023-09-23 — End: 2023-09-23
  Administered 2023-09-23: 50 mL via INTRAVENOUS

## 2023-09-23 MED ORDER — MAGNESIUM SULFATE 2 GM/50ML IV SOLN
2.0000 g | Freq: Once | INTRAVENOUS | Status: DC
  Administered 2023-09-23: 2 g via INTRAVENOUS
  Filled 2023-09-23: qty 50

## 2023-09-23 MED ORDER — HYDROCHLOROTHIAZIDE 25 MG PO TABS
25.0000 mg | ORAL_TABLET | Freq: Every day | ORAL | Status: DC
  Administered 2023-09-24: 25 mg via ORAL
  Filled 2023-09-23: qty 1

## 2023-09-23 MED ORDER — ENSURE ENLIVE PO LIQD
237.0000 mL | Freq: Two times a day (BID) | ORAL | Status: DC
  Administered 2023-09-23: 237 mL via ORAL

## 2023-09-23 MED ORDER — LISINOPRIL-HYDROCHLOROTHIAZIDE 20-12.5 MG PO TABS: 2.0000 | ORAL_TABLET | Freq: Every day | ORAL | Status: DC

## 2023-09-23 MED ORDER — ORAL CARE MOUTH RINSE: 15.0000 mL | OROMUCOSAL | Status: DC | PRN

## 2023-09-23 MED ORDER — ONDANSETRON HCL 4 MG PO TABS: 4.0000 mg | ORAL_TABLET | Freq: Four times a day (QID) | ORAL | Status: DC | PRN

## 2023-09-23 NOTE — Assessment & Plan Note (Signed)
Continue statin therapy.

## 2023-09-23 NOTE — Progress Notes (Signed)
09/23/2023 at 0312: CBG 59  2 orange juices given  09/23/2023 at 0335: CHG 59  25 g D50 given.

## 2023-09-23 NOTE — Assessment & Plan Note (Signed)
Calculated BMI 31,0 consistent with obesity class 1

## 2023-09-23 NOTE — Plan of Care (Signed)
  Problem: Nutrition: Goal: Adequate nutrition will be maintained Outcome: Not Progressing   Problem: Safety: Goal: Ability to remain free from injury will improve Outcome: Progressing   Problem: Skin Integrity: Goal: Risk for impaired skin integrity will decrease Outcome: Not Progressing

## 2023-09-23 NOTE — Assessment & Plan Note (Signed)
 Discontinue lisinopril and hydrochlorothiazide due to rise in serum cr Her systolic blood pressure has been 118 to 126 mmHg.  Continue blood pressure monitoring.

## 2023-09-23 NOTE — Discharge Instructions (Signed)
Providers Accepting New Patients in [place], [place]    [facility] [address]  [place], [place] [address] [phone] Accepts most insurances  [facility] [address] [place], [place] [address] [phone] Accepts most insurances  [facility] [address] [place], [place] [address]  [phone] Must meet requirements  [facility] [address] [place], [place] [address] [phone] Accepts most insurances  [facility] [address]  [place], [place] [address] [phone] Accepts most insurances  [facility] [address]   [place], [place]   [phone] Accepts most insurances  [facility])  [phone] [address]  [place], [place] [address] [phone] Accepts most insurances     [facility] [address]  [place], [place] [address] [phone] Accepts most insurances  [facility] [address] [place], [place] [address] [phone] option 1 Accepts Medicaid and [facility] [address]  [place], [place] [address] [phone] Accepts most insurances  Avon Gully, MD [address] [place], [place] [address] [phone] Accepts most insurances  [facility] at [facility] [address]  [place], [place] [address] [phone] Accepts most insurances  [facility] [phone] [address] [place], [place] [address] [phone] Accepts most insurances  [place], [place] [address], [address] [place], [place] [address] [phone]  Accepts most insurances   Carbohydrate Counting For People With Diabetes  Foods with carbohydrates make your blood glucose level go up. Learning how to count carbohydrates can help you control your blood glucose levels. First, identify the foods you eat that contain carbohydrates. Then,  using the Foods with Carbohydrates chart, determine about how much carbohydrates are in your meals and snacks. Make sure you are eating foods with fiber, protein, and healthy fat along with your carbohydrate foods. Foods with Carbohydrates The following table shows carbohydrate foods that have about 15 grams of carbohydrate each. Using measuring cups, spoons, or a food scale when you first begin learning about carbohydrate counting can help you learn about the portion sizes you typically eat. The following foods have 15 grams carbohydrate each:  Grains 1 slice bread (1 ounce)  1 small tortilla (6-inch size)   large bagel (1 ounce)  1/3 cup pasta or rice (cooked)   hamburger or hot dog bun ( ounce)   cup cooked cereal   to  cup ready-to-eat cereal  2 taco shells (5-inch size) Fruit 1 small fresh fruit ( to 1 cup)   medium banana  17 small grapes (3 ounces)  1 cup melon or berries   cup canned or frozen fruit  2 tablespoons dried fruit (blueberries, cherries, cranberries, raisins)   cup unsweetened fruit juice  Starchy Vegetables  cup cooked beans, peas, corn, potatoes/sweet potatoes   large baked potato (3 ounces)  1 cup acorn or butternut squash  Snack Foods 3 to 6 crackers  8 potato chips or 13 tortilla chips ( ounce to 1 ounce)  3 cups popped popcorn  Dairy 3/4 cup (6 ounces) nonfat plain yogurt, or yogurt with sugar-free sweetener  1 cup milk  1 cup plain rice, soy, coconut or flavored almond milk Sweets and Desserts  cup ice cream or frozen yogurt  1 tablespoon jam, jelly, pancake syrup, table sugar, or honey  2 tablespoons light pancake syrup  1 inch square of frosted cake or 2  inch square of unfrosted cake  2 small cookies (2/3 ounce each) or  large cookie  Sometimes you'll have to estimate carbohydrate amounts if you don't know the exact recipe. One cup of mixed foods like soups can have 1 to 2 carbohydrate servings, while some casseroles might have 2 or more  servings of carbohydrate. Foods that have less than 20 calories in each serving can be counted as "free" foods. Count 1 cup raw vegetables, or  cup cooked non-starchy vegetables as "free" foods. If you eat 3 or more servings at one meal, then count them as 1 carbohydrate serving.  Foods without Carbohydrates  Not all foods contain carbohydrates. Meat, some dairy, fats, non-starchy vegetables, and many beverages don't contain carbohydrate. So when you count carbohydrates, you can generally exclude chicken, pork, beef, fish, seafood, eggs, tofu, cheese, butter, sour cream, avocado, nuts, seeds, olives, mayonnaise, water, black coffee, unsweetened tea, and zero-calorie drinks. Vegetables with no or low carbohydrate include green beans, cauliflower, tomatoes, and onions. How much carbohydrate should I eat at each meal?  Carbohydrate counting can help you plan your meals and manage your weight. Following are some starting points for carbohydrate intake at each meal. Work with your registered dietitian nutritionist to find the best range that works for your blood glucose and weight.   To Lose Weight To Maintain Weight  Women 2 - 3 carb servings 3 - 4 carb servings  Men 3 - 4 carb servings 4 - 5 carb servings  Checking your blood glucose after meals will help you know if you need to adjust the timing, type, or number of carbohydrate servings in your meal plan. Achieve and keep a healthy body weight by balancing your food intake and physical activity.  Tips How should I plan my meals?  Plan for half the food on your plate to include non-starchy vegetables, like salad greens, broccoli, or carrots. Try to eat 3 to 5 servings of non-starchy vegetables every day. Have a protein food at each meal. Protein foods include chicken, fish, meat, eggs, or beans (note that beans contain carbohydrate). These two food groups (non-starchy vegetables and proteins) are low in carbohydrate. If you fill up your plate with these  foods, you will eat less carbohydrate but still fill up your stomach. Try to limit your carbohydrate portion to  of the plate.  What fats are healthiest to eat?  Diabetes increases risk for heart disease. To help protect your heart, eat more healthy fats, such as olive oil, nuts, and avocado. Eat less saturated fats like butter, cream, and high-fat meats, like bacon and sausage. Avoid trans fats, which are in all foods that list "partially hydrogenated oil" as an ingredient. What should I drink?  Choose drinks that are not sweetened with sugar. The healthiest choices are water, carbonated or seltzer waters, and tea and coffee without added sugars.  Sweet drinks will make your blood glucose go up very quickly. One serving of soda or energy drink is  cup. It is best to drink these beverages only if your blood glucose is low.  Artificially sweetened, or diet drinks, typically do not increase your blood glucose if they have zero calories in them. Read labels of beverages, as some diet drinks do have carbohydrate and will raise your blood glucose. Label Reading Tips Read Nutrition Facts labels to find out how many grams of carbohydrate are in a food you want to eat. Don't forget: sometimes serving sizes on the label aren't the same as  how much food you are going to eat, so you may need to calculate how much carbohydrate is in the food you are serving yourself.   Carbohydrate Counting for People with Diabetes Sample 1-Day Menu  Breakfast  cup yogurt, low fat, low sugar (1 carbohydrate serving)   cup cereal, ready-to-eat, unsweetened (1 carbohydrate serving)  1 cup strawberries (1 carbohydrate serving)   cup almonds ( carbohydrate serving)  Lunch 1, 5 ounce can chunk light tuna  2 ounces cheese, low fat cheddar  6 whole wheat crackers (1 carbohydrate serving)  1 small apple (1 carbohydrate servings)   cup carrots ( carbohydrate serving)   cup snap peas  1 cup 1% milk (1 carbohydrate serving)    Evening Meal Stir fry made with: 3 ounces chicken  1 cup brown rice (3 carbohydrate servings)   cup broccoli ( carbohydrate serving)   cup green beans   cup onions  1 tablespoon olive oil  2 tablespoons teriyaki sauce ( carbohydrate serving)  Evening Snack 1 extra small banana (1 carbohydrate serving)  1 tablespoon peanut butter   Carbohydrate Counting for People with Diabetes Vegan Sample 1-Day Menu  Breakfast 1 cup cooked oatmeal (2 carbohydrate servings)   cup blueberries (1 carbohydrate serving)  2 tablespoons flaxseeds  1 cup soymilk fortified with calcium and vitamin D  1 cup coffee  Lunch 2 slices whole wheat bread (2 carbohydrate servings)   cup baked tofu   cup lettuce  2 slices tomato  2 slices avocado   cup baby carrots ( carbohydrate serving)  1 orange (1 carbohydrate serving)  1 cup soymilk fortified with calcium and vitamin D   Evening Meal Burrito made with: 1 6-inch corn tortilla (1 carbohydrate serving)  1 cup refried vegetarian beans (2 carbohydrate servings)   cup chopped tomatoes   cup lettuce   cup salsa  1/3 cup brown rice (1 carbohydrate serving)  1 tablespoon olive oil for rice   cup zucchini   Evening Snack 6 small whole grain crackers (1 carbohydrate serving)  2 apricots ( carbohydrate serving)   cup unsalted peanuts ( carbohydrate serving)    Carbohydrate Counting for People with Diabetes Vegetarian (Lacto-Ovo) Sample 1-Day Menu  Breakfast 1 cup cooked oatmeal (2 carbohydrate servings)   cup blueberries (1 carbohydrate serving)  2 tablespoons flaxseeds  1 egg  1 cup 1% milk (1 carbohydrate serving)  1 cup coffee  Lunch 2 slices whole wheat bread (2 carbohydrate servings)  2 ounces low-fat cheese   cup lettuce  2 slices tomato  2 slices avocado   cup baby carrots ( carbohydrate serving)  1 orange (1 carbohydrate serving)  1 cup unsweetened tea  Evening Meal Burrito made with: 1 6-inch corn tortilla (1 carbohydrate  serving)   cup refried vegetarian beans (1 carbohydrate serving)   cup tomatoes   cup lettuce   cup salsa  1/3 cup brown rice (1 carbohydrate serving)  1 tablespoon olive oil for rice   cup zucchini  1 cup 1% milk (1 carbohydrate serving)  Evening Snack 6 small whole grain crackers (1 carbohydrate serving)  2 apricots ( carbohydrate serving)   cup unsalted peanuts ( carbohydrate serving)    Copyright 2020  Academy of Nutrition and Dietetics. All rights reserved.  Using Nutrition Labels: Carbohydrate  Serving Size  Look at the serving size. All the information on the label is based on this portion. Servings Per Container  The number of servings contained in the package. Guidelines for Carbohydrate  Look at the total grams of carbohydrate in the serving size.  1 carbohydrate choice = 15 grams of carbohydrate. Range of Carbohydrate Grams Per Choice  Carbohydrate Grams/Choice Carbohydrate Choices  6-10   11-20 1  21-25 1  26-35 2  36-40 2  41-50 3  51-55 3  56-65 4  66-70 4  71-80 5    Copyright 2020  Academy of Nutrition and Dietetics. All rights reserved.    Plate Method for Diabetes   Foods with carbohydrates make your blood glucose level go up. The plate method is a simple way to meal plan and control the amount of carbohydrate you eat.         Use the following guidance to build a healthy plate to control carbohydrates. Divide a 9-inch plate into 3 sections, and consider your beverage the 4th section of your meal: Food Group Examples of Foods/Beverages for This Section of your Meal  Section 1: Non-starchy vegetables Fill  of your plate to include non-starchy vegetables Asparagus, broccoli, brussels sprouts, cabbage, carrots, cauliflower, celery, cucumber, green beans, mushrooms, peppers, salad greens, tomatoes, or zucchini.  Section 2: Protein foods Fill  of your plate to include a lean protein Lean meat, poultry, fish, seafood, cheese, eggs, lean  deli meat, tofu, beans, lentils, nuts or nut butters.  Section 3: Carbohydrate foods Fill  of your plate to include carbohydrate foods Whole grains, whole wheat bread, brown rice, whole grain pasta, polenta, corn tortillas, fruit, or starchy vegetables (potatoes, green peas, corn, beans, acorn squash, and butternut squash). One cup of milk also counts as a food that contains carbohydrate.  Section 4: Beverage Choose water or a low-calorie drink for your beverage. Unsweetened tea, coffee, or flavored/sparkling water without added sugar.  Image reprinted with permission from The American Diabetes Association.  Copyright 2022 by the American Diabetes Association.   Copyright 2022  Academy of Nutrition and Dietetics. All rights reserved

## 2023-09-23 NOTE — Progress Notes (Signed)
  Progress Note   Patient: Jose Summers QIO:[id number] DOB: 1961-01-29 DOA: 09/22/2023     0 DOS: the patient was seen and examined on 09/23/2023   Brief hospital course: [name] was admitted to the hospital with the working diagnosis of hypoglycemia.   62 yo male with the past medical history of hypertension, hyperlipidemia, T2DM, and psoriasis who presented with generalized weakness related to hypoglycemia. Patient had episodic shaking episodes over the last 2 month, since he was started on glimepiride 2 mg. Noted poor oral intake.  On the day of admission he was noted to have altered mentation and persistent hypoglycemia and was transported to the ED.  On his initial physical examination his blood pressure was 132/88, HR 99, RR 27 and 02 saturation 100% , he was awake and alert, neurologically non focal, lungs with no wheezing or rales, heart with S1 and S2 present and regular, abdomen with no distention and no lower extremity edema.   Capillary glucose less than 10 and less than 27  NA 128, K 3,8 Cl 91, bicarbonate 23, glucose 33, bun 9 cr 0,93  Mg 1.2  AST 43, ALT 19 T bilirubin 0.7  Wbc 11.3 hgb 13,8 plt 320  TSH 7,4   Assessment and Plan: * Type 2 diabetes mellitus with hypoglycemia (HCC) Fasting glucose today is 115 mg/dl  Patient continue with poor oral intake.  Holding glimepiride.   Plan to continue close glucose monitoring Possible resumption of metformin at the time of his discharge.  His diabetes has been not controlled will check Hgb A1c/   Hyponatremia Hypomagnesemia.   Renal function today with serum cr at 0,85 with K at 3,8 and serum bicarbonate at 25 Na 126 and Mg 1,9   Plan to add Kcl 40 meq and 2 g Mag sulfate. Continue to encourage po intake Add protein supplements and consult nutrition for further evaluation.   Essential hypertension Continue blood pressure control with lisinopril and hydrochlorothiazide.    Mixed hyperlipidemia Continue statin  therapy.   Obesity (BMI 30-39.9) Calculated BMI 31,0 consistent with obesity class 1         Subjective: Patient continue to have poor oral intake, no chest pain, no dyspnea, no nausea or vomiting, continue very weak and deconditioned, has right foot pain   Physical Exam: Vitals:   09/23/23 0300 09/23/23 0400 09/23/23 0500 09/23/23 0823  BP: 119/80 108/72 (!) 149/77   Pulse: 88 94 86   Resp: 16 (!) 26 16   Temp:    97.9 F (36.6 C)  TempSrc:    Oral  SpO2: 97% 98% 97%   Weight:      Height:       Neurology awake and alert ENT with mild pallor Cardiovascular with S1 and S2 present and regular with no gallops, rubs or murmurs Respiratory with no rales or wheezing, no rhonchi Abdomen with no distention  No lower extremity edema  Skin with psoriasis changes  Data Reviewed:    Family Communication: I spoke with patient's wife and daugheter at the bedside, we talked in detail about patient's condition, plan of care and prognosis and all questions were addressed.   Disposition: Status is: Observation The patient remains OBS appropriate and will d/c before 2 midnights.  Planned Discharge Destination: Home    Author: Coralie Keens, MD 09/23/2023 9:23 AM  For on call review [email or web address].

## 2023-09-23 NOTE — Hospital Course (Signed)
Jose Summers was admitted to the hospital with the working diagnosis of hypoglycemia.   62 yo male with the past medical history of hypertension, hyperlipidemia, T2DM, and psoriasis who presented with generalized weakness related to hypoglycemia. Patient had episodic shaking episodes over the last 2 month, since he was started on glimepiride 2 mg. Noted poor oral intake.  On the day of admission he was noted to have altered mentation and persistent hypoglycemia and was transported to the ED.  On his initial physical examination his blood pressure was 132/88, HR 99, RR 27 and 02 saturation 100% , he was awake and alert, neurologically non focal, lungs with no wheezing or rales, heart with S1 and S2 present and regular, abdomen with no distention and no lower extremity edema.   Capillary glucose less than 10 and less than 27  NA 128, K 3,8 Cl 91, bicarbonate 23, glucose 33, bun 9 cr 0,93  Mg 1.2  AST 43, ALT 19 T bilirubin 0.7  Wbc 11.3 hgb 13,8 plt 320  TSH 7,4   12/24 patient with improvement in serum glucose.

## 2023-09-23 NOTE — Plan of Care (Signed)

## 2023-09-23 NOTE — Assessment & Plan Note (Signed)
Holding glimepiride with improvement in glucose.  Po intake has improved and he has been resumed on metformin.   Plan to continue close glucose monitoring as outpatient.  His Hgb A1c is 5.8

## 2023-09-23 NOTE — ED Notes (Signed)
ED TO INPATIENT HANDOFF REPORT  ED Nurse Name and Phone #: Rande Roylance Medic  S Name/Age/Gender Jose Summers 62 y.o. male Room/Bed: APA03/APA03  Code Status   Code Status: Not on file  Home/SNF/Other Home Patient oriented to: self, place, time, and situation Is this baseline? Yes   Triage Complete: Triage complete  Chief Complaint Hypoglycemia [E16.2]  Triage Note Pt has not been acting right per family and blood sugar in 30s. Pt had fall yesterday.   Allergies No Known Allergies  Level of Care/Admitting Diagnosis ED Disposition     ED Disposition  Admit   Condition  --   Comment  Hospital Area: [facility] [[id number]]  Level of Care: Stepdown [14]  Covid Evaluation: Asymptomatic - no recent exposure (last 10 days) testing not required  Diagnosis: Hypoglycemia [242204]  Admitting Physician: Frankey Shown [[id number]]  Attending Physician: Frankey Shown [[id number]]          B Medical/Surgery History Past Medical History:  Diagnosis Date   Diabetes mellitus without complication (HCC)    Hyperlipidemia    Hypertension    Psoriasis    Past Surgical History:  Procedure Laterality Date   KNEE SURGERY Right      A IV Location/Drains/Wounds Patient Lines/Drains/Airways Status     Active Line/Drains/Airways     Name Placement date Placement time Site Days   Peripheral IV 09/22/23 20 G 1.88" Anterior;Right Forearm 09/22/23  2209  Forearm  1   Peripheral IV 09/23/23 20 G 1" Anterior;Left Forearm 09/23/23  0009  Forearm  less than 1            Intake/Output Last 24 hours No intake or output data in the 24 hours ending 09/23/23 0025  Labs/Imaging Results for orders placed or performed during the hospital encounter of 09/22/23 (from the past 48 hours)  CBG monitoring, ED     Status: Abnormal   Collection Time: 09/22/23  9:45 PM  Result Value Ref Range   Glucose-Capillary <10 (LL) 70 - 99 mg/dL    Comment: Glucose reference range applies  only to samples taken after fasting for at least 8 hours.  CBG monitoring, ED     Status: Abnormal   Collection Time: 09/22/23 10:06 PM  Result Value Ref Range   Glucose-Capillary 27 (LL) 70 - 99 mg/dL    Comment: Glucose reference range applies only to samples taken after fasting for at least 8 hours.   Comment 1 Notify RN   CBC with Differential     Status: Abnormal   Collection Time: 09/22/23 10:17 PM  Result Value Ref Range   WBC 11.3 (H) 4.0 - 10.5 K/uL   RBC 4.24 4.22 - 5.81 MIL/uL   Hemoglobin 13.8 13.0 - 17.0 g/dL   HCT 19.1 (L) 47.8 - 29.5 %   MCV 91.5 80.0 - 100.0 fL   MCH 32.5 26.0 - 34.0 pg   MCHC 35.6 30.0 - 36.0 g/dL   RDW 62.1 30.8 - 65.7 %   Platelets 320 150 - 400 K/uL   nRBC 0.0 0.0 - 0.2 %   Neutrophils Relative % 85 %   Neutro Abs 9.5 (H) 1.7 - 7.7 K/uL   Lymphocytes Relative 8 %   Lymphs Abs 1.0 0.7 - 4.0 K/uL   Monocytes Relative 6 %   Monocytes Absolute 0.7 0.1 - 1.0 K/uL   Eosinophils Relative 1 %   Eosinophils Absolute 0.1 0.0 - 0.5 K/uL   Basophils Relative 0 %   Basophils Absolute  0.1 0.0 - 0.1 K/uL   Immature Granulocytes 0 %   Abs Immature Granulocytes 0.04 0.00 - 0.07 K/uL    Comment: Performed at [facility], [address], [place], [place] [address]  Troponin I (High Sensitivity)     Status: None   Collection Time: 09/22/23 10:17 PM  Result Value Ref Range   Troponin I (High Sensitivity) 4 <18 ng/L    Comment: (NOTE) Elevated high sensitivity troponin I (hsTnI) values and significant  changes across serial measurements may suggest ACS but many other  chronic and acute conditions are known to elevate hsTnI results.  Refer to the "Links" section for chest pain algorithms and additional  guidance. Performed at [facility], [address], [place], [place] [address]   TSH     Status: Abnormal   Collection Time: 09/22/23 10:17 PM  Result Value Ref Range   TSH 7.402 (H) 0.350 - 4.500 uIU/mL    Comment: Performed by a 3rd Generation assay  with a functional sensitivity of <=0.01 uIU/mL. Performed at [facility], [address], [place], [place] [address]   Comprehensive metabolic panel     Status: Abnormal   Collection Time: 09/22/23 10:17 PM  Result Value Ref Range   Sodium 128 (L) 135 - 145 mmol/L   Potassium 3.8 3.5 - 5.1 mmol/L   Chloride 91 (L) 98 - 111 mmol/L   CO2 23 22 - 32 mmol/L   Glucose, Bld 33 (LL) 70 - 99 mg/dL    Comment: CRITICAL RESULT CALLED TO, READ BACK BY AND VERIFIED WITH Afsana Liera,RN ON 09/22/23 AT 2316 BY PURDIE,J Glucose reference range applies only to samples taken after fasting for at least 8 hours.    BUN 9 8 - 23 mg/dL   Creatinine, Ser 8.29 0.61 - 1.24 mg/dL   Calcium 9.1 8.9 - 56.2 mg/dL   Total Protein 7.5 6.5 - 8.1 g/dL   Albumin 4.0 3.5 - 5.0 g/dL   AST 43 (H) 15 - 41 U/L   ALT 19 0 - 44 U/L   Alkaline Phosphatase 64 38 - 126 U/L   Total Bilirubin 0.7 <1.2 mg/dL   GFR, Estimated >13 >08 mL/min    Comment: (NOTE) Calculated using the CKD-EPI Creatinine Equation (2021)    Anion gap 14 5 - 15    Comment: Performed at [facility], [address], [place], [place] [address]  Magnesium     Status: Abnormal   Collection Time: 09/22/23 10:17 PM  Result Value Ref Range   Magnesium 1.2 (L) 1.7 - 2.4 mg/dL    Comment: Performed at [facility], [address], [place], [place] [address]  CBG monitoring, ED     Status: None   Collection Time: 09/22/23 10:34 PM  Result Value Ref Range   Glucose-Capillary 83 70 - 99 mg/dL    Comment: Glucose reference range applies only to samples taken after fasting for at least 8 hours.  CBG monitoring, ED     Status: Abnormal   Collection Time: 09/22/23 11:20 PM  Result Value Ref Range   Glucose-Capillary 48 (L) 70 - 99 mg/dL    Comment: Glucose reference range applies only to samples taken after fasting for at least 8 hours.  CBG monitoring, ED     Status: None   Collection Time: 09/23/23 12:18 AM  Result Value Ref Range   Glucose-Capillary 79 70 -  99 mg/dL    Comment: Glucose reference range applies only to samples taken after fasting for at  least 8 hours.   DG Chest 2 View Result Date: 09/23/2023 CLINICAL DATA:  Fall.  Rib pain EXAM: CHEST - 2 VIEW COMPARISON:  12/06/2010 FINDINGS: Heart and mediastinal contours are within normal limits. No focal opacities or effusions. No acute bony abnormality. IMPRESSION: No active cardiopulmonary disease. Electronically Signed   By: Charlett Nose M.D.   On: 09/23/2023 00:15    Pending Labs Unresulted Labs (From admission, onward)     Start     Ordered   09/23/23 0500  Cortisol  Once,   R        09/23/23 0500   09/22/23 2226  T4, free  Once,   URGENT        09/22/23 2225   09/22/23 2225  Hemoglobin A1c  Once,   URGENT        09/22/23 2225   09/22/23 2225  Urinalysis, Routine w reflex microscopic -Urine, Clean Catch  Once,   URGENT       Question:  Specimen Source  Answer:  Urine, Clean Catch   09/22/23 2225            Vitals/Pain Today's Vitals   09/22/23 2150 09/22/23 2151 09/22/23 2315 09/22/23 2330  BP:  134/81 124/77 121/75  Pulse:  88 84 86  Resp:  18 18 14   Temp:  98.3 F (36.8 C)    SpO2:  96% 95% 97%  Weight: 273 lb 5.9 oz (124 kg)     Height: 5\' 6"  (1.676 m)     PainSc: 7        Isolation Precautions No active isolations  Medications Medications  magnesium sulfate IVPB 2 g 50 mL (2 g Intravenous New Bag/Given 09/23/23 0010)  magnesium sulfate IVPB 2 g 50 mL (has no administration in time range)  dextrose (GLUTOSE) oral gel 40% (31 g Oral Given 09/22/23 2155)  dextrose 50 % solution 25 g (25 g Intravenous Given 09/22/23 2210)  dextrose 50 % solution 50 mL (50 mLs Intravenous Given 09/22/23 2330)  dextrose 10 % infusion ( Intravenous New Bag/Given 09/22/23 2333)  0.9 %  sodium chloride infusion ( Intravenous New Bag/Given 09/23/23 0020)    Mobility Walks with assistance     Focused Assessments    R Recommendations: See Admitting Provider Note  Report  given to:  Additional Notes:

## 2023-09-23 NOTE — H&P (Addendum)
History and Physical    Patient: Jose Summers ZOX:[id number] DOB: Jan 02, 1961 DOA: 09/22/2023 DOS: the patient was seen and examined on 09/23/2023 PCP: Pcp, No  Patient coming from: Home  Chief Complaint:  Chief Complaint  Patient presents with   Hypoglycemia   HPI: Jose Summers is a 62 y.o. male with medical history significant of hypertension, hyperlipidemia, type 2 diabetes mellitus, psoriasis who presents to the emergency department accompanied by wife and daughter due to low level of blood glucose which started this morning and this was associated with fluids and weakness.  Apparently, patient appears to have been having decreased appetite, he continues to eat, though with lower portion as compared to his baseline.  Patient has been presenting with intermittent shakiness within the last 2 months and complained of dizziness upon standing.  There was also complain of difficulty in ambulation due to painful knees.  He recently followed up with PCP due to psoriasis and was given prednisone and Decadron, but this has since been stopped.  Of note, patient was recently started on glimepiride about 2 months ago.  ED Course:  In the emergency department, he was hemodynamically stable.  Workup in the ED showed WBC of 11.3 and normocytic anemia.  BMP was normal except for hyponatremia, hypochloremia Hypoglycemia with CBG of 33 and AST of 43.  Magnesium 1.2, TSH 7.402, troponin x 2 was flat at 4. Chest x-ray showed no active cardiopulmonary disease. IV D50 x 3 was given with transitory increase in blood glucose, he was started on IV D10 W and IV NS was added to prevent worsening hyponatremia.  Magnesium was replenished. Hospitalist was asked to admit patient for further evaluation and management.  Review of Systems: Review of systems as noted in the HPI. All other systems reviewed and are negative.   Past Medical History:  Diagnosis Date   Diabetes mellitus without complication (HCC)     Hyperlipidemia    Hypertension    Psoriasis    Past Surgical History:  Procedure Laterality Date   KNEE SURGERY Right     Social History:  reports that he has never smoked. He has never been exposed to tobacco smoke. He has never used smokeless tobacco. He reports that he does not currently use alcohol. He reports that he does not currently use drugs.   No Known Allergies  History reviewed. No pertinent family history.    Prior to Admission medications   Medication Sig Start Date End Date Taking? Authorizing Provider  glimepiride (AMARYL) 2 MG tablet Take 2 mg by mouth daily with breakfast.   Yes [provider]  lisinopril-hydrochlorothiazide (ZESTORETIC) 20-12.5 MG tablet TAKE 2 TABLETS EVERY DAY BY ORAL ROUTE FOR 90 DAYS.   Yes [provider]  metFORMIN (GLUCOPHAGE) 850 MG tablet TAKE 1 TABLET TWICE A DAY BY MOUTH WITH MEALS FOR 90 DAYS.   Yes [provider]  atorvastatin (LIPITOR) 40 MG tablet TAKE 1 TABLET EVERY DAY BY MOUTH AT BEDTIME FOR 90 DAYS.    [provider]  hydrOXYzine (ATARAX) 25 MG tablet Take 1 tablet (25 mg total) by mouth every 8 (eight) hours as needed for itching. 07/13/23   Letta Kocher, NP    Physical Exam: BP 132/88   Pulse 99   Temp 98.5 F (36.9 C) (Oral)   Resp (!) 27   Ht 5\' 11"  (1.803 m)   Wt 100.9 kg   SpO2 100%   BMI 31.02 kg/m   General: 62 y.o. year-old  male well developed well nourished in no acute distress.  Alert and oriented x3. HEENT: NCAT, EOMI Neck: Supple, trachea medial Cardiovascular: Regular rate and rhythm with no rubs or gallops.  No thyromegaly or JVD noted.  No lower extremity edema. 2/4 pulses in all 4 extremities. Respiratory: Clear to auscultation with no wheezes or rales. Good inspiratory effort. Abdomen: Soft, nontender nondistended with normal bowel sounds x4 quadrants. Muskuloskeletal: No cyanosis, clubbing or edema noted bilaterally Neuro: CN II-XII intact, strength 5/5 x  4, sensation, reflexes intact Skin: No ulcerative lesions noted or rashes Psychiatry: Judgement and insight appear normal. Mood is appropriate for condition and setting          Labs on Admission:  Basic Metabolic Panel: Recent Labs  Lab 09/22/23 2217  NA 128*  K 3.8  CL 91*  CO2 23  GLUCOSE 33*  BUN 9  CREATININE 0.93  CALCIUM 9.1  MG 1.2*   Liver Function Tests: Recent Labs  Lab 09/22/23 2217  AST 43*  ALT 19  ALKPHOS 64  BILITOT 0.7  PROT 7.5  ALBUMIN 4.0   No results for input(s): "LIPASE", "AMYLASE" in the last 168 hours. No results for input(s): "AMMONIA" in the last 168 hours. CBC: Recent Labs  Lab 09/22/23 2217  WBC 11.3*  NEUTROABS 9.5*  HGB 13.8  HCT 38.8*  MCV 91.5  PLT 320   Cardiac Enzymes: No results for input(s): "CKTOTAL", "CKMB", "CKMBINDEX", "TROPONINI" in the last 168 hours.  BNP (last 3 results) No results for input(s): "BNP" in the last 8760 hours.  ProBNP (last 3 results) No results for input(s): "PROBNP" in the last 8760 hours.  CBG: Recent Labs  Lab 09/22/23 2320 09/23/23 0018 09/23/23 0109 09/23/23 0205 09/23/23 0312  GLUCAP 48* 79 120* 100* 59*    Radiological Exams on Admission: DG Chest 2 View Result Date: 09/23/2023 CLINICAL DATA:  Fall.  Rib pain EXAM: CHEST - 2 VIEW COMPARISON:  12/06/2010 FINDINGS: Heart and mediastinal contours are within normal limits. No focal opacities or effusions. No acute bony abnormality. IMPRESSION: No active cardiopulmonary disease. Electronically Signed   By: Charlett Nose M.D.   On: 09/23/2023 00:15    EKG: I independently viewed the EKG done and my findings are as followed: Normal sinus rhythm at a rate of 87 bpm  Assessment/Plan Present on Admission: **None**  Principal Problem:   Type 2 diabetes mellitus with hypoglycemia (HCC) Active Problems:   Hypomagnesemia   Hyponatremia   Essential hypertension   Mixed hyperlipidemia   Obesity (BMI 30-39.9)  Type 2 diabetes  mellitus with hypoglycemia This was possibly due to glimepiride side effects Continue IV D10W and continue to monitor blood glucose levels Glimepiride will be held at this time  Hypomagnesemia Mg level is 1.2 This will be replenished Please continue to monitor Mg level and correct accordingly  Hyponatremia Na 128, continue IV NS Continue to monitor sodium with serial BMPs Urine osmolality, serum osmolality and urine sodium will be checked  Failure to thrive in adult Family was concerned due to patient's decreased appetite Protein supplement will be provided Dietitian will be consulted and we shall await further recommendation  Essential hypertension Continue Zestoretic  Mixed hyperlipidemia Statin will be temporarily held due to mild elevation in AST  Obesity (BMI 31.02) Diet and lifestyle modification   DVT prophylaxis: Lovenox  Code Status: Full code  Family Communication: Wife and daughter at bedside (all questions answered to satisfaction)  Consults: None  Severity of Illness: The appropriate  patient status for this patient is OBSERVATION. Observation status is judged to be reasonable and necessary in order to provide the required intensity of service to ensure the patient's safety. The patient's presenting symptoms, physical exam findings, and initial radiographic and laboratory data in the context of their medical condition is felt to place them at decreased risk for further clinical deterioration. Furthermore, it is anticipated that the patient will be medically stable for discharge from the hospital within 2 midnights of admission.   Author: Frankey Shown, DO 09/23/2023 3:31 AM  For on call review [email or web address].

## 2023-09-23 NOTE — TOC CM/SW Note (Signed)
Transition of Care [facility]) - Inpatient Brief Assessment   Patient Details  Name: Jose Summers MRN: [id number] Date of Birth: [birth year]  Transition of Care [facility]) CM/SW Contact:    Inocencio Herb, LCSWA Phone Number: 09/23/2023, 12:01 PM   Clinical Narrative: Transition of Care Department [facility]) has reviewed patient and no TOC needs have been identified at this time. We will continue to monitor patient advancement through interdisciplinary progression rounds. If new patient transition needs arise, please place a TOC consult.  Transition of Care Asessment: Insurance and Status: Insurance coverage has been reviewed Patient has primary care physician: No (PCP list added to AVS) Home environment has been reviewed: from home Prior level of function:: independent Prior/Current Home Services: No current home services Social Drivers of Health Review: SDOH reviewed no interventions necessary Readmission risk has been reviewed: Yes Transition of care needs: no transition of care needs at this time

## 2023-09-23 NOTE — Assessment & Plan Note (Signed)
Hypomagnesemia.   Renal function today with serum cr at 0,85 with K at 3,8 and serum bicarbonate at 25 Na 126 and Mg 1,9   Plan to add Kcl 40 meq and 2 g Mag sulfate. Continue to encourage po intake Add protein supplements and consult nutrition for further evaluation.

## 2023-09-23 NOTE — Plan of Care (Signed)
  Problem: Nutrition: Goal: Adequate nutrition will be maintained Outcome: Progressing   Problem: Coping: Goal: Level of anxiety will decrease Outcome: Progressing   Problem: Safety: Goal: Ability to remain free from injury will improve Outcome: Progressing   Problem: Skin Integrity: Goal: Risk for impaired skin integrity will decrease Outcome: Progressing   

## 2023-09-24 DIAGNOSIS — E871 Hypo-osmolality and hyponatremia: Secondary | ICD-10-CM | POA: Diagnosis not present

## 2023-09-24 DIAGNOSIS — E782 Mixed hyperlipidemia: Secondary | ICD-10-CM | POA: Diagnosis not present

## 2023-09-24 DIAGNOSIS — I10 Essential (primary) hypertension: Secondary | ICD-10-CM | POA: Diagnosis not present

## 2023-09-24 DIAGNOSIS — E11649 Type 2 diabetes mellitus with hypoglycemia without coma: Secondary | ICD-10-CM | POA: Diagnosis not present

## 2023-09-24 LAB — GLUCOSE, CAPILLARY
Glucose-Capillary: 235 mg/dL — ABNORMAL HIGH (ref 70–99)
Glucose-Capillary: 210 mg/dL — ABNORMAL HIGH (ref 70–99)
Glucose-Capillary: 188 mg/dL — ABNORMAL HIGH (ref 70–99)
Glucose-Capillary: 253 mg/dL — ABNORMAL HIGH (ref 70–99)
Glucose-Capillary: 182 mg/dL — ABNORMAL HIGH (ref 70–99)

## 2023-09-24 LAB — BASIC METABOLIC PANEL
Anion gap: 8 (ref 5–15)
Anion gap: 8 (ref 5–15)
BUN: 11 mg/dL (ref 8–23)
BUN: 15 mg/dL (ref 8–23)
CO2: 23 mmol/L (ref 22–32)
CO2: 23 mmol/L (ref 22–32)
Calcium: 8.6 mg/dL — ABNORMAL LOW (ref 8.9–10.3)
Calcium: 8.5 mg/dL — ABNORMAL LOW (ref 8.9–10.3)
Chloride: 92 mmol/L — ABNORMAL LOW (ref 98–111)
Chloride: 92 mmol/L — ABNORMAL LOW (ref 98–111)
Creatinine, Ser: 0.89 mg/dL (ref 0.61–1.24)
Creatinine, Ser: 1.06 mg/dL (ref 0.61–1.24)
GFR, Estimated: >60 mL/min (ref >60)
GFR, Estimated: >60 mL/min (ref >60)
Glucose, Bld: 246 mg/dL — ABNORMAL HIGH (ref 70–99)
Glucose, Bld: 179 mg/dL — ABNORMAL HIGH (ref 70–99)
Potassium: 4.4 mmol/L (ref 3.5–5.1)
Potassium: 4.1 mmol/L (ref 3.5–5.1)
Sodium: 123 mmol/L — ABNORMAL LOW (ref 135–145)
Sodium: 123 mmol/L — ABNORMAL LOW (ref 135–145)

## 2023-09-24 LAB — HEMOGLOBIN A1C
Hgb A1c MFr Bld: 5.8 % — ABNORMAL HIGH (ref 4.8–5.6)
Mean Plasma Glucose: 120 mg/dL

## 2023-09-24 MED ORDER — SODIUM CHLORIDE 0.9 % IV BOLUS
250.0000 mL | Freq: Once | INTRAVENOUS | Status: AC
  Administered 2023-09-24: 250 mL via INTRAVENOUS

## 2023-09-24 MED ORDER — METFORMIN HCL 500 MG PO TABS
500.0000 mg | ORAL_TABLET | Freq: Every day | ORAL | Status: DC
  Administered 2023-09-24 – 2023-09-25 (×2): 500 mg via ORAL
  Filled 2023-09-24 (×2): qty 1

## 2023-09-24 NOTE — Progress Notes (Addendum)
Initial Nutrition Assessment  DOCUMENTATION CODES:   Not applicable  INTERVENTION:   D/C Glucerna and Ensure supplements. Diet education provided in discharge instructions.  NUTRITION DIAGNOSIS:   Altered nutrition lab value related to other (see comment) (decreased intake) as evidenced by other (comment) (hypoglycemia on admission).  GOAL:   Patient will meet greater than or equal to 90% of their needs  MONITOR:   PO intake  REASON FOR ASSESSMENT:   Consult Assessment of nutrition requirement/status, Diet education  ASSESSMENT:   62 yo male admitted with hypoglycemia. PMH includes HTN, HLD, DM-2, psoriasis.  Patient is currently on a regular diet. Meal intakes not recorded. He has Ensure and Glucerna shakes ordered. He was given supplements yesterday, but has refused them this morning. RD to discontinue the supplements. Patient no longer having hypoglycemia.   RD working remotely. Unable to reach patient by phone. Left DM education handouts in discharge instructions.   Labs reviewed. Na 123 CBG: [phone]  Medications reviewed and include hydrochlorothiazide, glucophage.  NUTRITION - FOCUSED PHYSICAL EXAM:  Unable to complete  Diet Order:   Diet Order             Diet regular Room service appropriate? Yes; Fluid consistency: Thin  Diet effective now                   EDUCATION NEEDS:   Education needs have been addressed  Skin:  Skin Assessment: Reviewed RN Assessment  Last BM:  12/21  Height:   Ht Readings from Last 1 Encounters:  09/23/23 5\' 11"  (1.803 m)    Weight:   Wt Readings from Last 1 Encounters:  09/23/23 100.9 kg    Ideal Body Weight:  78.2 kg  BMI:  Body mass index is 31.02 kg/m.  Estimated Nutritional Needs:   Kcal:  2000-2200  Protein:  100-120 gm  Fluid:  2-2.2 L   Gabriel Rainwater RD, LDN, CNSC Please refer to Amion for contact information.

## 2023-09-24 NOTE — TOC Progression Note (Signed)
Transition of Care [facility]) - Progression Note    Patient Details  Name: Jose Summers MRN: [id number] Date of Birth: Oct 22, 1960  Transition of [facility] CM/SW Contact  [place] Herb, [place] Phone Number: 09/24/2023, 10:54 AM  Clinical Narrative:    CSW updated by MD that pt would benefit from OP PT referral being made. CSW to make referral at this time. OP PT office will follow up with pt to set up appointments.    Barriers to Discharge: Continued Medical Work up  Expected Discharge Plan and Services                                               Social Determinants of Health (SDOH) Interventions SDOH Screenings   Food Insecurity: No Food Insecurity (09/23/2023)  Housing: Low Risk  (09/23/2023)  Transportation Needs: No Transportation Needs (09/23/2023)  Utilities: Not At Risk (09/23/2023)  Tobacco Use: Low Risk  (09/23/2023)    Readmission Risk Interventions     No data to display

## 2023-09-24 NOTE — Plan of Care (Signed)
?  Problem: Education: ?Goal: Knowledge of General Education information will improve ?Description: Including pain rating scale, medication(s)/side effects and non-pharmacologic comfort measures ?Outcome: Progressing ?  ?Problem: Health Behavior/Discharge Planning: ?Goal: Ability to manage health-related needs will improve ?Outcome: Progressing ?  ?Problem: Coping: ?Goal: Level of anxiety will decrease ?Outcome: Progressing ?  ?

## 2023-09-24 NOTE — Discharge Summary (Signed)
Physician Discharge Summary   Patient: Jose Summers MRN: [id number] DOB: [birth year]  Admit date:     09/22/2023  Discharge date: 09/24/23  Discharge Physician: York Ram Amori Cooperman   PCP: Pcp, No   Recommendations at discharge:    Stop glimepiride due to hypoglycemia Continue bid metformin Follow up with Primary Care in 7 to 10 days.    Discharge Diagnoses: Principal Problem:   Type 2 diabetes mellitus with hypoglycemia (HCC) Active Problems:   Hyponatremia   Essential hypertension   Mixed hyperlipidemia   Obesity (BMI 30-39.9)  Resolved Problems:   * No resolved hospital problems. [facility] Course: Jose Summers was admitted to the hospital with the working diagnosis of hypoglycemia.   62 yo male with the past medical history of hypertension, hyperlipidemia, T2DM, and psoriasis who presented with generalized weakness related to hypoglycemia. Patient had episodic shaking episodes over the last 2 month, since he was started on glimepiride 2 mg. Noted poor oral intake.  On the day of admission he was noted to have altered mentation and persistent hypoglycemia and was transported to the ED.  On his initial physical examination his blood pressure was 132/88, HR 99, RR 27 and 02 saturation 100% , he was awake and alert, neurologically non focal, lungs with no wheezing or rales, heart with S1 and S2 present and regular, abdomen with no distention and no lower extremity edema.   Capillary glucose less than 10 and less than 27  NA 128, K 3,8 Cl 91, bicarbonate 23, glucose 33, bun 9 cr 0,93  Mg 1.2  AST 43, ALT 19 T bilirubin 0.7  Wbc 11.3 hgb 13,8 plt 320  TSH 7,4   12/24 patient with improvement in serum glucose.   Assessment and Plan: * Type 2 diabetes mellitus with hypoglycemia (HCC) Holding glimepiride with improvement in glucose.  Po intake has improved and he has been resumed on metformin.   Plan to continue close glucose monitoring as outpatient.  His Hgb A1c is 5.8     Hyponatremia Hypomagnesemia.   Pending repeat BPM .   Essential hypertension Continue blood pressure control with lisinopril and hydrochlorothiazide.    Mixed hyperlipidemia Continue statin therapy.   Obesity (BMI 30-39.9) Calculated BMI 31,0 consistent with obesity class 1          Consultants: none  Procedures performed: none   Disposition: Home Diet recommendation:  Discharge Diet Orders (From admission, onward)     Start     Ordered   09/24/23 0000  Diet - low sodium heart healthy        09/24/23 1620           Cardiac and Carb modified diet DISCHARGE MEDICATION: Allergies as of 09/24/2023   No Known Allergies      Medication List     STOP taking these medications    glimepiride 2 MG tablet Commonly known as: AMARYL       TAKE these medications    atorvastatin 40 MG tablet Commonly known as: LIPITOR TAKE 1 TABLET EVERY DAY BY MOUTH AT BEDTIME FOR 90 DAYS.   lisinopril-hydrochlorothiazide 20-12.5 MG tablet Commonly known as: ZESTORETIC TAKE 2 TABLETS EVERY DAY BY ORAL ROUTE FOR 90 DAYS.   metFORMIN 850 MG tablet Commonly known as: GLUCOPHAGE TAKE 1 TABLET TWICE A DAY BY MOUTH WITH MEALS FOR 90 DAYS.        Discharge Exam: Filed Weights   09/22/23 2150 09/23/23 0100  Weight: 124 kg 100.9 kg  BP 123/83   Pulse 97   Temp 98.6 F (37 C) (Oral)   Resp 16   Ht 5\' 11"  (1.803 m)   Wt 100.9 kg   SpO2 97%   BMI 31.02 kg/m   Patient is feeling better, no chest pain or dyspnea, strength has improved along with po intake.  Neurology awake and alert ENT with no pallor Cardiovascular with S1 and S2 present and regular with no gallops rubs or murmurs Respiratory with no rales or wheezing Abdomen with no distention  No lower extremity edema   Condition at discharge: stable  The results of significant diagnostics from this hospitalization (including imaging, microbiology, ancillary and laboratory) are listed below for  reference.   Imaging Studies: DG Chest 2 View Result Date: 09/23/2023 CLINICAL DATA:  Fall.  Rib pain EXAM: CHEST - 2 VIEW COMPARISON:  12/06/2010 FINDINGS: Heart and mediastinal contours are within normal limits. No focal opacities or effusions. No acute bony abnormality. IMPRESSION: No active cardiopulmonary disease. Electronically Signed   By: Charlett Nose M.D.   On: 09/23/2023 00:15    Microbiology: Results for orders placed or performed during the hospital encounter of 09/22/23  MRSA Next Gen by PCR, Nasal     Status: None   Collection Time: 09/23/23  1:35 AM   Specimen: Nasal Mucosa; Nasal Swab  Result Value Ref Range Status   MRSA by PCR Next Gen NOT DETECTED NOT DETECTED Final    Comment: (NOTE) The GeneXpert MRSA Assay (FDA approved for NASAL specimens only), is one component of a comprehensive MRSA colonization surveillance program. It is not intended to diagnose MRSA infection nor to guide or monitor treatment for MRSA infections. Test performance is not FDA approved in patients less than 65 years old. Performed at [facility], [address] [place], [place] [address]     Labs: CBC: Recent Labs  Lab 09/22/23 2217 09/23/23 0449  WBC 11.3* 8.7  NEUTROABS 9.5*  --   HGB 13.8 12.6*  HCT 38.8* 36.2*  MCV 91.5 91.4  PLT 320 278   Basic Metabolic Panel: Recent Labs  Lab 09/22/23 2217 09/23/23 0449 09/24/23 0359  NA 128* 126* 123*  K 3.8 3.8 4.4  CL 91* 93* 92*  CO2 23 25 23   GLUCOSE 33* 115* 246*  BUN 9 9 11   CREATININE 0.93 0.85 0.89  CALCIUM 9.1 8.3* 8.6*  MG 1.2* 1.9  --   PHOS  --  2.6  --    Liver Function Tests: Recent Labs  Lab 09/22/23 2217 09/23/23 0449  AST 43* 32  ALT 19 17  ALKPHOS 64 55  BILITOT 0.7 0.5  PROT 7.5 5.9*  ALBUMIN 4.0 3.1*   CBG: Recent Labs  Lab 09/23/23 2249 09/24/23 0204 09/24/23 0559 09/24/23 0731 09/24/23 1141  GLUCAP 234* 235* 210* 188* 253*    Discharge time spent: greater than 30  minutes.  Signed: Coralie Keens, MD [facility] 09/24/2023

## 2023-09-25 DIAGNOSIS — E11649 Type 2 diabetes mellitus with hypoglycemia without coma: Secondary | ICD-10-CM | POA: Diagnosis not present

## 2023-09-25 LAB — BASIC METABOLIC PANEL
Anion gap: 9 (ref 5–15)
BUN: 13 mg/dL (ref 8–23)
CO2: 23 mmol/L (ref 22–32)
Calcium: 8.6 mg/dL — ABNORMAL LOW (ref 8.9–10.3)
Chloride: 94 mmol/L — ABNORMAL LOW (ref 98–111)
Creatinine, Ser: 0.87 mg/dL (ref 0.61–1.24)
GFR, Estimated: >60 mL/min (ref >60)
Glucose, Bld: 142 mg/dL — ABNORMAL HIGH (ref 70–99)
Potassium: 4.1 mmol/L (ref 3.5–5.1)
Sodium: 126 mmol/L — ABNORMAL LOW (ref 135–145)

## 2023-09-25 LAB — GLUCOSE, CAPILLARY
Glucose-Capillary: 136 mg/dL — ABNORMAL HIGH (ref 70–99)
Glucose-Capillary: 153 mg/dL — ABNORMAL HIGH (ref 70–99)
Glucose-Capillary: 166 mg/dL — ABNORMAL HIGH (ref 70–99)
Glucose-Capillary: 173 mg/dL — ABNORMAL HIGH (ref 70–99)

## 2023-09-25 LAB — HEMOGLOBIN A1C
Hgb A1c MFr Bld: 5.4 % (ref 4.8–5.6)
Mean Plasma Glucose: 108 mg/dL

## 2023-09-25 MED ORDER — SODIUM CHLORIDE 0.9 % IV BOLUS
250.0000 mL | Freq: Once | INTRAVENOUS | Status: AC
  Administered 2023-09-25: 250 mL via INTRAVENOUS

## 2023-09-25 NOTE — Plan of Care (Signed)
  Problem: Health Behavior/Discharge Planning: Goal: Ability to manage health-related needs will improve Outcome: Progressing   Problem: Clinical Measurements: Goal: Ability to maintain clinical measurements within normal limits will improve Outcome: Progressing Goal: Will remain free from infection Outcome: Progressing Goal: Diagnostic test results will improve Outcome: Progressing   Problem: Pain Management: Goal: General experience of comfort will improve Outcome: Progressing   Problem: Safety: Goal: Ability to remain free from injury will improve Outcome: Progressing   Problem: Skin Integrity: Goal: Risk for impaired skin integrity will decrease Outcome: Progressing

## 2023-09-25 NOTE — Progress Notes (Signed)
Patient discharged home today, transported home by family. Discharge summary went over with patient and daughter Jose Summers, verbalized understanding. Belongings sent home with patient.

## 2023-09-25 NOTE — Discharge Summary (Signed)
Physician Discharge Summary   Patient: Jose Summers MRN: [id number] DOB: [birth year]  Admit date:     09/22/2023  Discharge date: 09/25/23  Discharge Physician: Kendell Bane   PCP: Pcp, No   The patient was seen and examined this morning, stable to be discharged  Was held back due to hyponatremia  Recommendations at discharge:   Stop glimepiride due to hypoglycemia Continue bid metformin Keep checking CBG before meals Stop BP meds-due to electrolyte abnormality and soft BP BMP in 1 week results to PCP (monitoring serum sodium level, kidney function) Follow up with Primary Care in 7 to 10 days.    Discharge Diagnoses: Principal Problem:   Type 2 diabetes mellitus with hypoglycemia (HCC) Active Problems:   Hyponatremia   Essential hypertension   Mixed hyperlipidemia   Obesity (BMI 30-39.9)  Resolved Problems:   * No resolved hospital problems. [facility] Course: Jose Summers was admitted to the hospital with the working diagnosis of hypoglycemia.   62 yo male with the past medical history of hypertension, hyperlipidemia, T2DM, and psoriasis who presented with generalized weakness related to hypoglycemia. Patient had episodic shaking episodes over the last 2 month, since he was started on glimepiride 2 mg. Noted poor oral intake.  On the day of admission he was noted to have altered mentation and persistent hypoglycemia and was transported to the ED.  On his initial physical examination his blood pressure was 132/88, HR 99, RR 27 and 02 saturation 100% , he was awake and alert, neurologically non focal, lungs with no wheezing or rales, heart with S1 and S2 present and regular, abdomen with no distention and no lower extremity edema.   Capillary glucose less than 10 and less than 27  NA 128, K 3,8 Cl 91, bicarbonate 23, glucose 33, bun 9 cr 0,93  Mg 1.2  AST 43, ALT 19 T bilirubin 0.7  Wbc 11.3 hgb 13,8 plt 320  TSH 7,4   12/24 patient with improvement in serum  glucose.    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  Po intake has improved and he has been resumed on metformin.   Plan to continue close glucose monitoring as outpatient.  His Hgb A1c is 5.8  >> 5.4,  Hyponatremia -Serum sodium 126, 123, 126 today Hypomagnesemia.   Pending repeat BPM .   Essential hypertension BP remains soft, discontinue lisinopril and hydrochlorothiazide (also due to electrolyte normality)   Mixed hyperlipidemia Continue statin therapy.   Obesity (BMI 30-39.9) Calculated BMI 31,0 consistent with obesity class 1          Consultants: none  Procedures performed: none   Disposition: Home Diet recommendation:  Discharge Diet Orders (From admission, onward)     Start     Ordered   09/24/23 0000  Diet - low sodium heart healthy        09/24/23 1620           Cardiac and Carb modified diet DISCHARGE MEDICATION: Allergies as of 09/25/2023   No Known Allergies      Medication List     STOP taking these medications    glimepiride 2 MG tablet Commonly known as: AMARYL   lisinopril-hydrochlorothiazide 20-12.5 MG tablet Commonly known as: ZESTORETIC       TAKE these medications    atorvastatin 40 MG tablet Commonly known as: LIPITOR TAKE 1 TABLET EVERY DAY BY MOUTH AT BEDTIME FOR 90 DAYS.   metFORMIN 850  MG tablet Commonly known as: GLUCOPHAGE TAKE 1 TABLET TWICE A DAY BY MOUTH WITH MEALS FOR 90 DAYS.        Discharge Exam: Filed Weights   09/22/23 2150 09/23/23 0100  Weight: 124 kg 100.9 kg   BP 115/74   Pulse 85   Temp 98.7 F (37.1 C) (Oral)   Resp 18   Ht 5\' 11"  (1.803 m)   Wt 100.9 kg   SpO2 94%   BMI 31.02 kg/m       General:  AAO x 3,  cooperative, no distress;   HEENT:  Normocephalic, PERRL, otherwise with in Normal limits   Neuro:  CNII-XII intact. , normal motor and sensation, reflexes intact   Lungs:   Clear to auscultation BL,  Respirations unlabored,  No wheezes / crackles  Cardio:    S1/S2, RRR, No murmure, No Rubs or Gallops   Abdomen:  Soft, non-tender, bowel sounds active all four quadrants, no guarding or peritoneal signs.  Muscular  skeletal:  Limited exam -global generalized weaknesses - in bed, able to move all 4 extremities,   2+ pulses,  symmetric, No pitting edema  Skin:  Dry, warm to touch, negative for any Rashes,  Wounds: Please see nursing documentation          Condition at discharge: stable  The results of significant diagnostics from this hospitalization (including imaging, microbiology, ancillary and laboratory) are listed below for reference.   Imaging Studies: DG Chest 2 View Result Date: 09/23/2023 CLINICAL DATA:  Fall.  Rib pain EXAM: CHEST - 2 VIEW COMPARISON:  12/06/2010 FINDINGS: Heart and mediastinal contours are within normal limits. No focal opacities or effusions. No acute bony abnormality. IMPRESSION: No active cardiopulmonary disease. Electronically Signed   By: Charlett Nose M.D.   On: 09/23/2023 00:15    Microbiology: Results for orders placed or performed during the hospital encounter of 09/22/23  MRSA Next Gen by PCR, Nasal     Status: None   Collection Time: 09/23/23  1:35 AM   Specimen: Nasal Mucosa; Nasal Swab  Result Value Ref Range Status   MRSA by PCR Next Gen NOT DETECTED NOT DETECTED Final    Comment: (NOTE) The GeneXpert MRSA Assay (FDA approved for NASAL specimens only), is one component of a comprehensive MRSA colonization surveillance program. It is not intended to diagnose MRSA infection nor to guide or monitor treatment for MRSA infections. Test performance is not FDA approved in patients less than 7 years old. Performed at [facility], [address], [place], [place] [address]     Labs: CBC: Recent Labs  Lab 09/22/23 2217 09/23/23 0449  WBC 11.3* 8.7  NEUTROABS 9.5*  --   HGB 13.8 12.6*  HCT 38.8* 36.2*  MCV 91.5 91.4  PLT 320 278    Basic Metabolic Panel: Recent Labs  Lab 09/22/23 2217 09/23/23 0449 09/24/23 0359 09/24/23 1544 09/25/23 0406  NA 128* 126* 123* 123* 126*  K 3.8 3.8 4.4 4.1 4.1  CL 91* 93* 92* 92* 94*  CO2 23 25 23 23 23   GLUCOSE 33* 115* 246* 179* 142*  BUN 9 9 11 15 13   CREATININE 0.93 0.85 0.89 1.06 0.87  CALCIUM 9.1 8.3* 8.6* 8.5* 8.6*  MG 1.2* 1.9  --   --   --   PHOS  --  2.6  --   --   --    Liver Function Tests: Recent Labs  Lab 09/22/23 2217 09/23/23 0449  AST 43* 32  ALT 19 17  ALKPHOS 64 55  BILITOT 0.7 0.5  PROT 7.5 5.9*  ALBUMIN 4.0 3.1*   CBG: Recent Labs  Lab 09/24/23 2003 09/25/23 0038 09/25/23 0348 09/25/23 0740 09/25/23 1113  GLUCAP 182* 153* 166* 136* 173*    Discharge time spent: greater than 40 minutes.  Signed: Kendell Bane, MD [facility] 09/25/2023

## 2023-12-27 ENCOUNTER — Ambulatory Visit: Admitting: Family Medicine

## 2023-12-27 VITALS — BP 132/82 | HR 86 | Temp 98.3°F | Ht 71.0 in | Wt 207.0 lb

## 2023-12-27 DIAGNOSIS — E118 Type 2 diabetes mellitus with unspecified complications: Secondary | ICD-10-CM

## 2023-12-27 DIAGNOSIS — R7989 Other specified abnormal findings of blood chemistry: Secondary | ICD-10-CM

## 2023-12-27 DIAGNOSIS — Z7984 Long term (current) use of oral hypoglycemic drugs: Secondary | ICD-10-CM | POA: Diagnosis not present

## 2023-12-27 DIAGNOSIS — E222 Syndrome of inappropriate secretion of antidiuretic hormone: Secondary | ICD-10-CM | POA: Diagnosis not present

## 2023-12-27 NOTE — Progress Notes (Signed)
 Subjective:    Patient ID: Jose Summers, male    DOB: 1961-08-17, 63 y.o.   MRN: [id number]  HPI Patient is here today to establish care.  He has a history of diabetes.  In December, he went to the hospital unable to speak, he was extremely dizzy, he was throwing up.  In the emergency room, his blood sugar was extremely low and his sodium level was in the 120 range.  He was told that his sodium level being low was the cause of his dizziness and his nausea.  He is trying to find a primary care physician to help manage his diabetes.  He continues to have frequent episodes of dizziness and nausea.  However his dizziness seems to be positional.  He states that if he bends over, the room starts to spin and he becomes nauseated and feels like he needs to throw up or he will throw up.  He denies any headaches.  He denies any seizure activity.  He does drink excessively.  He estimates that he drinks 6 Gatorade's a day because he sweats a lot at work Past Medical History:  Diagnosis Date   Diabetes mellitus without complication (HCC)    Hyperlipidemia    Hypertension    Psoriasis    Past Surgical History:  Procedure Laterality Date   KNEE SURGERY Right    Current Outpatient Medications on File Prior to Visit  Medication Sig Dispense Refill   amLODipine (NORVASC) 5 MG tablet Take 5 mg by mouth daily.     lisinopril (ZESTRIL) 40 MG tablet Take 40 mg by mouth daily.     metFORMIN (GLUCOPHAGE) 850 MG tablet TAKE 1 TABLET TWICE A DAY BY MOUTH WITH MEALS FOR 90 DAYS.     atorvastatin (LIPITOR) 40 MG tablet TAKE 1 TABLET EVERY DAY BY MOUTH AT BEDTIME FOR 90 DAYS. (Patient not taking: Reported on 12/27/2023)     No current facility-administered medications on file prior to visit.   No Known Allergies Social History   Socioeconomic History   Marital status: Married    Spouse name: Not on file   Number of children: Not on file   Years of education: Not on file   Highest education level: Not on file   Occupational History   Not on file  Tobacco Use   Smoking status: Never    Passive exposure: Never   Smokeless tobacco: Never  Vaping Use   Vaping status: Never Used  Substance and Sexual Activity   Alcohol use: Not Currently   Drug use: Not Currently   Sexual activity: Not on file  Other Topics Concern   Not on file  Social History Narrative   Not on file   Social Drivers of Health   Financial Resource Strain: Not on file  Food Insecurity: No Food Insecurity (09/23/2023)   Hunger Vital Sign    Worried About Running Out of Food in the Last Year: Never true    Ran Out of Food in the Last Year: Never true  Transportation Needs: No Transportation Needs (09/23/2023)   PRAPARE - Administrator, Civil Service (Medical): No    Lack of Transportation (Non-Medical): No  Physical Activity: Not on file  Stress: Not on file  Social Connections: Not on file  Intimate Partner Violence: Not At Risk (09/23/2023)   Humiliation, Afraid, Rape, and Kick questionnaire    Fear of Current or Ex-Partner: No    Emotionally Abused: No    Physically  Abused: No    Sexually Abused: No      Review of Systems  All other systems reviewed and are negative.      Objective:   Physical Exam Vitals reviewed.  Constitutional:      Appearance: Normal appearance. He is normal weight. He is not ill-appearing or toxic-appearing.  Cardiovascular:     Rate and Rhythm: Normal rate and regular rhythm.     Heart sounds: Normal heart sounds. No murmur heard.    No gallop.  Pulmonary:     Effort: Pulmonary effort is normal. No respiratory distress.     Breath sounds: Normal breath sounds. No wheezing or rales.  Neurological:     General: No focal deficit present.     Mental Status: He is alert.     Coordination: Coordination abnormal.     Gait: Gait abnormal.           Assessment & Plan:  Controlled type 2 diabetes mellitus with complication, without long-term current use of  insulin (HCC) - Plan: Hemoglobin A1c, COMPLETE METABOLIC PANEL WITHOUT GFR, CBC with Differential/Platelet, Lipid panel, Microalbumin / creatinine urine ratio  Elevated TSH - Plan: TSH  SIADH (syndrome of inappropriate ADH production) (HCC) - Plan: Osmolality, Osmolality, urine, Sodium, urine, random, Cortisol I am very concerned the patient may have extremely low sodium due to SIADH.  Therefore manage check serum osmolality, urine osmolality, urine sodium, urine creatinine.  I recommended that he restrict his fluids to less than 1200 today.  I explained SIADH to the patient and the importance of fluid restriction.  If the diagnosis is confirmed, I would also recommend imaging of the brain to rule out a space-occupying lesion due to his abnormal unsteady gait that he has today.  He staggers when he walks and he states that this has been going on for months.  Some of his symptoms sound like vertigo however.  Therefore with imaging and lab work is normal, consider treatment for vertigo.  Obtain baseline labs to monitor his diabetes as well as his thyroid.

## 2023-12-28 LAB — CBC WITH DIFFERENTIAL/PLATELET
Absolute Lymphocytes: 845 {cells}/uL — ABNORMAL LOW (ref 850–3900)
Absolute Monocytes: 554 {cells}/uL (ref 200–950)
Basophils Absolute: 28 {cells}/uL (ref 0–200)
Basophils Relative: 0.4 %
Eosinophils Absolute: 92 {cells}/uL (ref 15–500)
Eosinophils Relative: 1.3 %
HCT: 43.6 % (ref 38.5–50.0)
Hemoglobin: 14.9 g/dL (ref 13.2–17.1)
MCH: 30.8 pg (ref 27.0–33.0)
MCHC: 34.2 g/dL (ref 32.0–36.0)
MCV: 90.1 fL (ref 80.0–100.0)
MPV: 9.6 fL (ref 7.5–12.5)
Monocytes Relative: 7.8 %
Neutro Abs: 5581 {cells}/uL (ref 1500–7800)
Neutrophils Relative %: 78.6 %
Platelets: 278 10*3/uL (ref 140–400)
RBC: 4.84 10*6/uL (ref 4.20–5.80)
RDW: 12.8 % (ref 11.0–15.0)
Total Lymphocyte: 11.9 %
WBC: 7.1 10*3/uL (ref 3.8–10.8)

## 2023-12-28 LAB — COMPLETE METABOLIC PANEL WITHOUT GFR
AG Ratio: 1.6 (calc) (ref 1.0–2.5)
ALT: 17 U/L (ref 9–46)
AST: 19 U/L (ref 10–35)
Albumin: 4.5 g/dL (ref 3.6–5.1)
Alkaline phosphatase (APISO): 61 U/L (ref 35–144)
BUN: 20 mg/dL (ref 7–25)
CO2: 23 mmol/L (ref 20–32)
Calcium: 9.6 mg/dL (ref 8.6–10.3)
Chloride: 98 mmol/L (ref 98–110)
Creat: 0.92 mg/dL (ref 0.70–1.35)
Globulin: 2.9 g/dL (ref 1.9–3.7)
Glucose, Bld: 115 mg/dL — ABNORMAL HIGH (ref 65–99)
Potassium: 4.1 mmol/L (ref 3.5–5.3)
Sodium: 132 mmol/L — ABNORMAL LOW (ref 135–146)
Total Bilirubin: 0.9 mg/dL (ref 0.2–1.2)
Total Protein: 7.4 g/dL (ref 6.1–8.1)

## 2023-12-28 LAB — LIPID PANEL
Cholesterol: 144 mg/dL (ref <200)
HDL: 51 mg/dL (ref >=40)
LDL Cholesterol (Calc): 80 mg/dL
Non-HDL Cholesterol (Calc): 93 mg/dL (ref <130)
Total CHOL/HDL Ratio: 2.8 (calc) (ref <5.0)
Triglycerides: 57 mg/dL (ref <150)

## 2023-12-28 LAB — MICROALBUMIN / CREATININE URINE RATIO
Creatinine, Urine: 236 mg/dL (ref 20–320)
Microalb Creat Ratio: 14 mg/g{creat} (ref <30)
Microalb, Ur: 3.4 mg/dL

## 2023-12-28 LAB — SODIUM, URINE, RANDOM: Sodium, Ur: 40 mmol/L (ref 28–272)

## 2023-12-28 LAB — HEMOGLOBIN A1C
Hgb A1c MFr Bld: 5.6 %{Hb} (ref <5.7)
Mean Plasma Glucose: 114 mg/dL
eAG (mmol/L): 6.3 mmol/L

## 2023-12-28 LAB — CORTISOL: Cortisol, Plasma: 18.1 ug/dL

## 2023-12-28 LAB — OSMOLALITY, URINE: Osmolality, Ur: 762 mosm/kg (ref 50–1200)

## 2023-12-28 LAB — OSMOLALITY: Osmolality: 281 mosm/kg (ref 278–305)

## 2023-12-28 LAB — TSH: TSH: 4.71 m[IU]/L — ABNORMAL HIGH (ref 0.40–4.50)

## 2023-12-30 ENCOUNTER — Other Ambulatory Visit: Payer: Self-pay | Admitting: Family Medicine

## 2023-12-30 DIAGNOSIS — H8110 Benign paroxysmal vertigo, unspecified ear: Secondary | ICD-10-CM

## 2024-09-18 ENCOUNTER — Inpatient Hospital Stay (HOSPITAL_COMMUNITY)
Admission: EM | Admit: 2024-09-18 | Discharge: 2024-09-28 | DRG: 481 | Disposition: A | Discharge status: Skilled Nursing Facility

## 2024-09-18 ENCOUNTER — Emergency Department (HOSPITAL_COMMUNITY)

## 2024-09-18 ENCOUNTER — Encounter (HOSPITAL_COMMUNITY): Payer: Self-pay

## 2024-09-18 ENCOUNTER — Other Ambulatory Visit: Payer: Self-pay

## 2024-09-18 DIAGNOSIS — E782 Mixed hyperlipidemia: Secondary | ICD-10-CM | POA: Diagnosis present

## 2024-09-18 DIAGNOSIS — Z7401 Bed confinement status: Secondary | ICD-10-CM | POA: Diagnosis not present

## 2024-09-18 DIAGNOSIS — F32A Depression, unspecified: Secondary | ICD-10-CM | POA: Diagnosis present

## 2024-09-18 DIAGNOSIS — S72002A Fracture of unspecified part of neck of left femur, initial encounter for closed fracture: Secondary | ICD-10-CM | POA: Diagnosis present

## 2024-09-18 DIAGNOSIS — E669 Obesity, unspecified: Secondary | ICD-10-CM | POA: Diagnosis present

## 2024-09-18 DIAGNOSIS — S72142A Displaced intertrochanteric fracture of left femur, initial encounter for closed fracture: Secondary | ICD-10-CM | POA: Diagnosis present

## 2024-09-18 DIAGNOSIS — E66812 Obesity, class 2: Secondary | ICD-10-CM | POA: Diagnosis present

## 2024-09-18 DIAGNOSIS — E871 Hypo-osmolality and hyponatremia: Secondary | ICD-10-CM | POA: Diagnosis present

## 2024-09-18 DIAGNOSIS — Z7984 Long term (current) use of oral hypoglycemic drugs: Secondary | ICD-10-CM | POA: Diagnosis not present

## 2024-09-18 DIAGNOSIS — I10 Essential (primary) hypertension: Secondary | ICD-10-CM | POA: Diagnosis present

## 2024-09-18 DIAGNOSIS — E860 Dehydration: Secondary | ICD-10-CM | POA: Diagnosis present

## 2024-09-18 DIAGNOSIS — W19XXXA Unspecified fall, initial encounter: Secondary | ICD-10-CM | POA: Diagnosis present

## 2024-09-18 DIAGNOSIS — R54 Age-related physical debility: Secondary | ICD-10-CM | POA: Diagnosis present

## 2024-09-18 DIAGNOSIS — M25552 Pain in left hip: Secondary | ICD-10-CM | POA: Diagnosis not present

## 2024-09-18 DIAGNOSIS — N179 Acute kidney failure, unspecified: Secondary | ICD-10-CM | POA: Diagnosis present

## 2024-09-18 DIAGNOSIS — E11649 Type 2 diabetes mellitus with hypoglycemia without coma: Secondary | ICD-10-CM | POA: Diagnosis present

## 2024-09-18 DIAGNOSIS — Z79899 Other long term (current) drug therapy: Secondary | ICD-10-CM

## 2024-09-18 DIAGNOSIS — Z6832 Body mass index (BMI) 32.0-32.9, adult: Secondary | ICD-10-CM | POA: Diagnosis not present

## 2024-09-18 DIAGNOSIS — D62 Acute posthemorrhagic anemia: Secondary | ICD-10-CM | POA: Diagnosis not present

## 2024-09-18 DIAGNOSIS — E861 Hypovolemia: Secondary | ICD-10-CM | POA: Diagnosis present

## 2024-09-18 LAB — CBC WITH DIFFERENTIAL/PLATELET
Abs Immature Granulocytes: 0.01 K/uL (ref 0.00–0.07)
Basophils Absolute: 0 K/uL (ref 0.0–0.1)
Basophils Relative: 1 %
Eosinophils Absolute: 0.1 K/uL (ref 0.0–0.5)
Eosinophils Relative: 2 %
HCT: 35 % — ABNORMAL LOW (ref 39.0–52.0)
Hemoglobin: 11.4 g/dL — ABNORMAL LOW (ref 13.0–17.0)
Immature Granulocytes: 0 %
Lymphocytes Relative: 20 %
Lymphs Abs: 1.1 K/uL (ref 0.7–4.0)
MCH: 26.1 pg (ref 26.0–34.0)
MCHC: 32.6 g/dL (ref 30.0–36.0)
MCV: 80.1 fL (ref 80.0–100.0)
Monocytes Absolute: 0.6 K/uL (ref 0.1–1.0)
Monocytes Relative: 11 %
Neutro Abs: 3.7 K/uL (ref 1.7–7.7)
Neutrophils Relative %: 66 %
Platelets: 269 K/uL (ref 150–400)
RBC: 4.37 MIL/uL (ref 4.22–5.81)
RDW: 17.1 % — ABNORMAL HIGH (ref 11.5–15.5)
WBC: 5.5 K/uL (ref 4.0–10.5)
nRBC: 0 % (ref 0.0–0.2)

## 2024-09-18 LAB — PROTIME-INR
INR: 1 (ref 0.8–1.2)
Prothrombin Time: 13.4 s (ref 11.4–15.2)

## 2024-09-18 LAB — BASIC METABOLIC PANEL WITH GFR
Anion gap: 15 (ref 5–15)
BUN: 15 mg/dL (ref 8–23)
CO2: 22 mmol/L (ref 22–32)
Calcium: 8.8 mg/dL — ABNORMAL LOW (ref 8.9–10.3)
Chloride: 93 mmol/L — ABNORMAL LOW (ref 98–111)
Creatinine, Ser: 0.93 mg/dL (ref 0.61–1.24)
GFR, Estimated: >60 mL/min
Glucose, Bld: 112 mg/dL — ABNORMAL HIGH (ref 70–99)
Potassium: 3.8 mmol/L (ref 3.5–5.1)
Sodium: 130 mmol/L — ABNORMAL LOW (ref 135–145)

## 2024-09-18 MED ORDER — FENTANYL CITRATE (PF) 100 MCG/2ML IJ SOLN
50.0000 ug | Freq: Once | INTRAMUSCULAR | Status: AC
  Administered 2024-09-18: 50 ug via INTRAVENOUS
  Filled 2024-09-18: qty 2

## 2024-09-18 MED ORDER — LISINOPRIL 20 MG PO TABS
40.0000 mg | ORAL_TABLET | Freq: Every day | ORAL | Status: DC
  Administered 2024-09-20 – 2024-09-21 (×2): 40 mg via ORAL
  Filled 2024-09-18 (×3): qty 2

## 2024-09-18 MED ORDER — AMLODIPINE BESYLATE 5 MG PO TABS
5.0000 mg | ORAL_TABLET | Freq: Every day | ORAL | Status: DC
  Administered 2024-09-20 – 2024-09-28 (×9): 5 mg via ORAL
  Filled 2024-09-18 (×9): qty 1

## 2024-09-18 MED ORDER — INSULIN ASPART 100 UNIT/ML IJ SOLN
0.0000 [IU] | Freq: Three times a day (TID) | INTRAMUSCULAR | Status: DC
  Administered 2024-09-20 – 2024-09-26 (×9): 1 [IU] via SUBCUTANEOUS
  Filled 2024-09-18 (×7): qty 1

## 2024-09-18 MED ORDER — DOCUSATE SODIUM 100 MG PO CAPS
100.0000 mg | ORAL_CAPSULE | Freq: Two times a day (BID) | ORAL | Status: DC
  Administered 2024-09-19 – 2024-09-25 (×12): 100 mg via ORAL
  Filled 2024-09-18 (×12): qty 1

## 2024-09-18 MED ORDER — MORPHINE SULFATE (PF) 2 MG/ML IV SOLN
2.0000 mg | INTRAVENOUS | Status: DC | PRN
  Administered 2024-09-19: 2 mg via INTRAVENOUS
  Filled 2024-09-18: qty 1

## 2024-09-18 MED ORDER — ACETAMINOPHEN 325 MG PO TABS: 650.0000 mg | ORAL_TABLET | Freq: Four times a day (QID) | ORAL | Status: DC | PRN

## 2024-09-18 MED ORDER — ONDANSETRON HCL 4 MG PO TABS: 4.0000 mg | ORAL_TABLET | Freq: Four times a day (QID) | ORAL | Status: DC | PRN

## 2024-09-18 MED ORDER — SENNA 8.6 MG PO TABS
1.0000 | ORAL_TABLET | Freq: Two times a day (BID) | ORAL | Status: DC
  Administered 2024-09-19 – 2024-09-28 (×18): 8.6 mg via ORAL
  Filled 2024-09-18 (×18): qty 1

## 2024-09-18 MED ORDER — ENOXAPARIN SODIUM 40 MG/0.4ML IJ SOSY: 40.0000 mg | PREFILLED_SYRINGE | INTRAMUSCULAR | Status: DC

## 2024-09-18 MED ORDER — BENZONATATE 100 MG PO CAPS
100.0000 mg | ORAL_CAPSULE | Freq: Three times a day (TID) | ORAL | Status: DC | PRN
  Administered 2024-09-19 – 2024-09-27 (×10): 100 mg via ORAL
  Filled 2024-09-18 (×11): qty 1

## 2024-09-18 MED ORDER — ACETAMINOPHEN 650 MG RE SUPP: 650.0000 mg | Freq: Four times a day (QID) | RECTAL | Status: DC | PRN

## 2024-09-18 MED ORDER — HYDROCODONE-ACETAMINOPHEN 5-325 MG PO TABS
1.0000 | ORAL_TABLET | ORAL | Status: DC | PRN
  Administered 2024-09-19 – 2024-09-22 (×10): 2 via ORAL
  Administered 2024-09-22: 1 via ORAL
  Administered 2024-09-23 (×2): 2 via ORAL
  Administered 2024-09-23: 1 via ORAL
  Administered 2024-09-24 – 2024-09-27 (×7): 2 via ORAL
  Filled 2024-09-18: qty 2
  Filled 2024-09-18: qty 1
  Filled 2024-09-18 (×8): qty 2
  Filled 2024-09-18: qty 1
  Filled 2024-09-18 (×10): qty 2

## 2024-09-18 MED ORDER — ATORVASTATIN CALCIUM 40 MG PO TABS: 40.0000 mg | ORAL_TABLET | Freq: Every day | ORAL | Status: DC

## 2024-09-18 MED ORDER — ONDANSETRON HCL 4 MG/2ML IJ SOLN: 4.0000 mg | Freq: Four times a day (QID) | INTRAMUSCULAR | Status: DC | PRN

## 2024-09-18 NOTE — ED Provider Notes (Signed)
 " Jose Summers   Jose Summers   Jose Summers is a 63 y.o. male.    Jose Summers        Jose Summers is a 63 y.o. male past medical history of hypertension, type 2 diabetes who presents to the Emergency Department complaining of Summers of his left Jose.  States he was getting out of a truck when he twisted and felt Summers to his Jose which caused him to fall.  He is unable to bear weight to his left leg.  He denies other injuries, numbness of the extremity.  Denies head injury neck Summers and LOC.  No previous injuries to the left Jose.  He was brought in by EMS and received IV morphine  en route but continues to have significant Summers   Prior to Admission medications  Medication Sig Start Date End Date Taking? Authorizing Provider  amLODipine  (NORVASC ) 5 MG tablet Take 5 mg by mouth daily.    [provider]  atorvastatin  (LIPITOR) 40 MG tablet TAKE 1 TABLET EVERY DAY BY MOUTH AT BEDTIME FOR 90 DAYS. Patient not taking: Reported on 12/27/2023    [provider]  lisinopril  (ZESTRIL ) 40 MG tablet Take 40 mg by mouth daily.    [provider]  metFORMIN  (GLUCOPHAGE ) 850 MG tablet TAKE 1 TABLET TWICE A DAY BY MOUTH WITH MEALS FOR 90 DAYS.    [provider]    Allergies: Patient has no known allergies.    Review of Systems  Musculoskeletal:  Positive for arthralgias (left Jose Summers).  All other systems reviewed and are negative.   Updated Vital Signs Ht 5' 9 (1.753 m)   Wt 108.9 kg   BMI 35.44 kg/m   Physical Exam Vitals and nursing Summers reviewed.  Constitutional:      General: He is not in acute distress.    Appearance: Normal appearance. He is not toxic-appearing.  HENT:     Head: Atraumatic.  Cardiovascular:     Rate and Rhythm: Normal rate and regular rhythm.     Pulses: Normal pulses.  Pulmonary:      Effort: Pulmonary effort is normal.  Abdominal:     Palpations: Abdomen is soft.     Tenderness: There is no abdominal tenderness.  Musculoskeletal:        General: Tenderness and signs of injury present. No swelling.     Left Jose: Tenderness and bony tenderness present. No crepitus. Decreased range of motion. Normal strength.     Comments: Summers with any movement of the left Jose.  Left leg appears shortened and externally rotated.    Skin:    General: Skin is warm.     Capillary Refill: Capillary refill takes less than 2 seconds.  Neurological:     General: No focal deficit present.     Mental Status: He is alert.     Sensory: No sensory deficit.     Motor: No weakness.     (all labs ordered are listed, but only abnormal results are displayed) Labs Reviewed  CBC WITH DIFFERENTIAL/PLATELET - Abnormal; Notable for the following components:      Result Value   Hemoglobin 11.4 (*)    HCT 35.0 (*)    RDW 17.1 (*)    All other components within normal limits  PROTIME-INR  BASIC METABOLIC PANEL WITH GFR  EKG: None  Radiology: DG Chest Portable 1 View Result Date: 09/18/2024 CLINICAL DATA:  Jose fracture EXAM: PORTABLE CHEST 1 VIEW COMPARISON:  09/22/2023 FINDINGS: Low lung volumes. Mild cardiomegaly with central bronchovascular crowding. No acute airspace disease, pleural effusion or pneumothorax. Aortic atherosclerosis IMPRESSION: Low lung volumes with mild cardiomegaly. Electronically Signed   By: Luke Bun M.D.   On: 09/18/2024 20:00   DG Jose Unilat W or Wo Pelvis 2-3 Views Left Result Date: 09/18/2024 CLINICAL DATA:  Jose Summers after fall. EXAM: DG Jose (WITH OR WITHOUT PELVIS) 2-3V LEFT COMPARISON:  None Available. FINDINGS: Comminuted and displaced intertrochanteric left proximal femur fracture. There is displacement involving the lesser and greater trochanter. No Jose dislocation. Mild bilateral Jose osteoarthritis. Intact bony pelvis. No pubic symphyseal or sacroiliac  diastasis. IMPRESSION: Comminuted and displaced intertrochanteric left proximal femur fracture. Electronically Signed   By: Andrea Gasman M.D.   On: 09/18/2024 18:55     Procedures   Medications Ordered in the ED  fentaNYL  (SUBLIMAZE ) injection 50 mcg (has no administration in time range)  insulin  aspart (novoLOG ) injection 0-6 Units (has no administration in time range)  amLODipine  (NORVASC ) tablet 5 mg (has no administration in time range)  atorvastatin  (LIPITOR) tablet 40 mg (has no administration in time range)  lisinopril  (ZESTRIL ) tablet 40 mg (has no administration in time range)  acetaminophen  (TYLENOL ) tablet 650 mg (has no administration in time range)    Or  acetaminophen  (TYLENOL ) suppository 650 mg (has no administration in time range)  HYDROcodone -acetaminophen  (NORCO/VICODIN) 5-325 MG per tablet 1-2 tablet (has no administration in time range)  morphine  (PF) 2 MG/ML injection 2 mg (has no administration in time range)  ondansetron  (ZOFRAN ) tablet 4 mg (has no administration in time range)    Or  ondansetron  (ZOFRAN ) injection 4 mg (has no administration in time range)  senna (SENOKOT) tablet 8.6 mg (has no administration in time range)  docusate sodium  (COLACE) capsule 100 mg (has no administration in time range)  fentaNYL  (SUBLIMAZE ) injection 50 mcg (50 mcg Intravenous Given 09/18/24 1856)                                    Medical Decision Making   Pt here for eval of left Jose Summers after a twisting injury that also resulted in a fall.  He has been unable to bear weight since the fall.  No head injury or LOC.    I suspect fx as his exam shows a shortened and externally rotated left leg.  NV intact.  He does not take blood thinners.  Dislocation, MSK injury also considered.    Amount and/or Complexity of Data Reviewed Labs: ordered.    Details: Labs showed sodium of 130, appears baseline.  no leukocytosis hemoglobin 11.4 Radiology: ordered.    Details: X-ray  of the left Jose shows comminuted and displaced intertrochanteric proximal femur fracture ECG/medicine tests: ordered.    Details: Normal sinus rhythm low voltage QRS Discussion of management or test interpretation with external provider(s):   Consulted orthopedics Dr. Jerri in [place].  He requests pt be admitted to hospitalist service and transferred to [facility].  N.p.o. after midnight with plan for surgery tomorrow morning  Consulted [facility] hospitalist, Dr. Mannie who agrees to admit   Risk Prescription drug management. Decision regarding hospitalization.        Final diagnoses:  Closed fracture of left Jose, initial encounter (HCC)  ED Discharge Orders     None          Herlinda Milling, DEVONNA 09/18/24 2232    Francesca Elsie CROME, MD 09/18/24 2316  "

## 2024-09-18 NOTE — ED Notes (Signed)
NP at bedside during triage 

## 2024-09-18 NOTE — H&P (Signed)
 " History and Physical    Jose Summers FMW:[id number] DOB: 01/26/1961 DOA: 09/18/2024  PCP: Duanne Butler DASEN, MD Patient coming from: Home  Chief Complaint: left hip pain   HPI: Jose Summers is a 63 y.o. male with medical history significant of obese type II, hyperlipidemia, hypertension, chronic hyponatremia, psoriasis   According to the patient's  daughter, her father went to the grocery store and he was attempted to get back into his truck. The patient however twisted into an awkward position and as result fell backwards onto his left hip. He immediately started to experience left hip pain and hence the decision to come to the hospital for further evaluation and treatment.   ED Course:  In the ER, BP 149/85, HR 75, RR 18, O2 saturation 94% on RA,  and Tmax 97.7. Cbc demonstrated wbc 5.5,  hb/hct 11.4/35, and platelet 269. Chemistry demonstrated Na 130, K 3.8, Cl 93, bicarb 22, Bun/Cr 15/ 0.93 and glucose 112. Anion gap  15. Initial troponin was.  CXR demonstrated low lung volumes and mild cardiomegaly.  Left hip x-ray demonstrated comminuted and displaced anterior trochanteric left proximal femur fracture. EKG normal sinus rhythm.  Review of Systems:  All systems reviewed and apart from history of presenting illness, are negative.  Past Medical History:  Diagnosis Date   Diabetes mellitus without complication (HCC)    Hyperlipidemia    Hypertension    Psoriasis     Past Surgical History:  Procedure Laterality Date   KNEE SURGERY Right      reports that he has never smoked. He has never been exposed to tobacco smoke. He has never used smokeless tobacco. He reports that he does not currently use alcohol. He reports that he does not currently use drugs.  Allergies[1]  History reviewed. No pertinent family history.  Prior to Admission medications  Medication Sig Start Date End Date Taking? Authorizing Provider  amLODipine  (NORVASC ) 5 MG tablet Take 5 mg by mouth daily.     [provider]  atorvastatin  (LIPITOR) 40 MG tablet TAKE 1 TABLET EVERY DAY BY MOUTH AT BEDTIME FOR 90 DAYS. Patient not taking: Reported on 12/27/2023    [provider]  lisinopril  (ZESTRIL ) 40 MG tablet Take 40 mg by mouth daily.    [provider]  metFORMIN  (GLUCOPHAGE ) 850 MG tablet TAKE 1 TABLET TWICE A DAY BY MOUTH WITH MEALS FOR 90 DAYS.    [provider]    Physical Exam: Vitals:   09/18/24 1753 09/18/24 1800 09/18/24 1852  BP:   126/78  Pulse:  74   Resp:  16   SpO2:  94%   Weight: 108.9 kg    Height: 5' 9 (1.753 m)      Physical Exam Constitutional:      General: He is not in acute distress.    Appearance: Normal appearance.  HENT:     Head: Normocephalic and atraumatic.  Eyes:     Extraocular Movements: Extraocular movements intact.     Conjunctiva/sclera: Conjunctivae normal.     Pupils: Pupils are equal, round, and reactive to light.  Cardiovascular:     Rate and Rhythm: Normal rate and regular rhythm.     Pulses: Normal pulses.     Heart sounds: Normal heart sounds.  Pulmonary:     Effort: Pulmonary effort is normal. No respiratory distress.     Breath sounds: Normal breath sounds. No wheezing, rhonchi or rales.  Abdominal:     General: Abdomen is  flat. Bowel sounds are normal. There is no distension.     Palpations: Abdomen is soft.     Tenderness: There is no abdominal tenderness.  Musculoskeletal:        General: internal rotation of the left leg and decreased range of motion Skin:    General: Skin is warm and dry.     Coloration: Skin is not jaundiced.  Neurological:     General: No focal deficit present.     Mental Status: He is alert and oriented to person, place, and time. Mental status is at baseline.   Labs on Admission: I have personally reviewed following labs and imaging studies  CBC: Recent Labs  Lab 09/18/24 1835  WBC 5.5  NEUTROABS 3.7  HGB 11.4*  HCT 35.0*  MCV 80.1  PLT 269   Basic  Metabolic Panel: Recent Labs  Lab 09/18/24 1835  NA 130*  K 3.8  CL 93*  CO2 22  GLUCOSE 112*  BUN 15  CREATININE 0.93  CALCIUM  8.8*   GFR: Estimated Creatinine Clearance: 100.2 mL/min (by C-G formula based on SCr of 0.93 mg/dL). Liver Function Tests: No results for input(s): AST, ALT, ALKPHOS, BILITOT, PROT, ALBUMIN in the last 168 hours. No results for input(s): LIPASE, AMYLASE in the last 168 hours. No results for input(s): AMMONIA in the last 168 hours. Coagulation Profile: Recent Labs  Lab 09/18/24 1835  INR 1.0   Cardiac Enzymes: No results for input(s): CKTOTAL, CKMB, CKMBINDEX, TROPONINI in the last 168 hours. BNP (last 3 results) No results for input(s): PROBNP in the last 8760 hours. HbA1C: No results for input(s): HGBA1C in the last 72 hours. CBG: No results for input(s): GLUCAP in the last 168 hours. Lipid Profile: No results for input(s): CHOL, HDL, LDLCALC, TRIG, CHOLHDL, LDLDIRECT in the last 72 hours. Thyroid  Function Tests: No results for input(s): TSH, T4TOTAL, FREET4, T3FREE, THYROIDAB in the last 72 hours. Anemia Panel: No results for input(s): VITAMINB12, FOLATE, FERRITIN, TIBC, IRON, RETICCTPCT in the last 72 hours. Urine analysis: No results found for: COLORURINE, APPEARANCEUR, LABSPEC, PHURINE, GLUCOSEU, HGBUR, BILIRUBINUR, KETONESUR, PROTEINUR, UROBILINOGEN, NITRITE, LEUKOCYTESUR  Radiological Exams on Admission: DG Chest Portable 1 View Result Date: 09/18/2024 CLINICAL DATA:  Hip fracture EXAM: PORTABLE CHEST 1 VIEW COMPARISON:  09/22/2023 FINDINGS: Low lung volumes. Mild cardiomegaly with central bronchovascular crowding. No acute airspace disease, pleural effusion or pneumothorax. Aortic atherosclerosis IMPRESSION: Low lung volumes with mild cardiomegaly. Electronically Signed   By: Luke Bun M.D.   On: 09/18/2024 20:00   DG Hip Unilat W or Wo Pelvis  2-3 Views Left Result Date: 09/18/2024 CLINICAL DATA:  Hip pain after fall. EXAM: DG HIP (WITH OR WITHOUT PELVIS) 2-3V LEFT COMPARISON:  None Available. FINDINGS: Comminuted and displaced intertrochanteric left proximal femur fracture. There is displacement involving the lesser and greater trochanter. No hip dislocation. Mild bilateral hip osteoarthritis. Intact bony pelvis. No pubic symphyseal or sacroiliac diastasis. IMPRESSION: Comminuted and displaced intertrochanteric left proximal femur fracture. Electronically Signed   By: Andrea Gasman M.D.   On: 09/18/2024 18:55    EKG: Independently reviewed.   Assessment/Plan Principal Problem:   Closed left hip fracture, initial encounter [facility]) Active Problems:   Type 2 diabetes mellitus with hypoglycemia (HCC)   Essential hypertension   Mixed hyperlipidemia   Obesity (BMI 30-39.9)   Closed left fracture, initial encounter Patient will be admitted to the hospital He will be started on  multimodal pain regimen Orthopedic surgery has been consulted  They are planning  on taking patient to the OR in the morning Will continue to monitor closely  Preoperative evaluation Patient is stable and cleared for intermediate surgical procedure RCRI score is 1 for preoperative insulin   This is secondary to low dose SSI Patient is low risk for cardiac arrest in intermediate surgery   Hyponatremia Na is 130 This is chronic in nature Appears that outpatient workup has been done  TSH was found to be normal,slightly elevated at 5 Not greater than 10 to require starting levothyroxine On prior discharge thought to be secondary to hypertensive medications Hydrochlorothiazide  and lisinopril  were both discontinued Will monitor with repeat bmp If low then plan on starting salt tabs   Type 2 DM  Patient will be started on low dose sliding scale insulin  Will add basal dose insulin  as needed  Hypertension Patient not currently on any medications  Would  monitor closely Prn hydralazine as needed If persistently elevated then will start on norvasc  daily Patient will be restarted on lisinopril  as well   HLD Will continue statin therapy   Diet : NPO after midnight: before he can have consistent carb diet  DVT prophylaxis: scds Code Status: Full  Family Communication: none  Disposition Plan: inpatient   Consults called: orthopedics Admission status: inpatient  Level of care: Level of care:  The medical decision making is of moderate complexity, therefore this is a level 2 visit.  Bradly MARLA Drones MD [facility]  If 7PM-7AM, please contact night-coverage [email or web address]  09/18/2024, 10:01 PM       [1] No Known Allergies  "

## 2024-09-18 NOTE — ED Triage Notes (Signed)
 Pt arrived [facility] from store. Pt was getting out of his truck and twisted wrong and fell to the floor. Left hip pain and unable to move hip without extreme pain. 20 G right hand- 10 mg Morphine  given by [facility].

## 2024-09-18 NOTE — Progress Notes (Signed)
 Consult received from EDP.  Pertinent imaging reviewed which shows left intertrochanteric fx.  Will need operative treatment.  Surgery has been scheduled for tomorrow at [facility].  Please admit to hospitalist service.  NPO after midnight.

## 2024-09-19 ENCOUNTER — Inpatient Hospital Stay (HOSPITAL_COMMUNITY)

## 2024-09-19 ENCOUNTER — Other Ambulatory Visit: Payer: Self-pay

## 2024-09-19 ENCOUNTER — Encounter (HOSPITAL_COMMUNITY): Admission: EM | Disposition: A | Discharge status: Skilled Nursing Facility | Payer: Self-pay | Source: Home / Self Care

## 2024-09-19 ENCOUNTER — Encounter (HOSPITAL_COMMUNITY): Payer: Self-pay

## 2024-09-19 DIAGNOSIS — S72142A Displaced intertrochanteric fracture of left femur, initial encounter for closed fracture: Secondary | ICD-10-CM | POA: Diagnosis not present

## 2024-09-19 HISTORY — PX: INTRAMEDULLARY (IM) NAIL INTERTROCHANTERIC: SHX5875

## 2024-09-19 LAB — SURGICAL PCR SCREEN
MRSA, PCR: NEGATIVE
Staphylococcus aureus: NEGATIVE

## 2024-09-19 LAB — GLUCOSE, CAPILLARY
Glucose-Capillary: 122 mg/dL — ABNORMAL HIGH (ref 70–99)
Glucose-Capillary: 111 mg/dL — ABNORMAL HIGH (ref 70–99)
Glucose-Capillary: 162 mg/dL — ABNORMAL HIGH (ref 70–99)
Glucose-Capillary: 137 mg/dL — ABNORMAL HIGH (ref 70–99)
Glucose-Capillary: 177 mg/dL — ABNORMAL HIGH (ref 70–99)

## 2024-09-19 MED ORDER — PROPOFOL 10 MG/ML IV BOLUS
INTRAVENOUS | Status: AC
  Filled 2024-09-19: qty 20

## 2024-09-19 MED ORDER — FENTANYL CITRATE (PF) 100 MCG/2ML IJ SOLN
INTRAMUSCULAR | Status: AC
  Filled 2024-09-19: qty 2

## 2024-09-19 MED ORDER — ONDANSETRON HCL 4 MG/2ML IJ SOLN: 4.0000 mg | Freq: Once | INTRAMUSCULAR | Status: DC | PRN

## 2024-09-19 MED ORDER — DEXMEDETOMIDINE HCL IN NACL 400 MCG/100ML IV SOLN
INTRAVENOUS | Status: DC | PRN
  Administered 2024-09-19: 8 ug via INTRAVENOUS

## 2024-09-19 MED ORDER — TRANEXAMIC ACID-NACL 1000-0.7 MG/100ML-% IV SOLN
1000.0000 mg | INTRAVENOUS | Status: AC
  Administered 2024-09-19: 1000 mg via INTRAVENOUS

## 2024-09-19 MED ORDER — ORAL CARE MOUTH RINSE: 15.0000 mL | Freq: Once | OROMUCOSAL | Status: AC

## 2024-09-19 MED ORDER — HYDROCODONE-ACETAMINOPHEN 5-325 MG PO TABS: 1.0000 | ORAL_TABLET | Freq: Three times a day (TID) | ORAL | 0 refills | Status: DC | PRN

## 2024-09-19 MED ORDER — CEFAZOLIN SODIUM-DEXTROSE 2-4 GM/100ML-% IV SOLN
INTRAVENOUS | Status: AC
  Filled 2024-09-19: qty 100

## 2024-09-19 MED ORDER — GLYCOPYRROLATE 0.2 MG/ML IJ SOLN
INTRAMUSCULAR | Status: DC | PRN
  Administered 2024-09-19: .1 mg via INTRAVENOUS

## 2024-09-19 MED ORDER — ONDANSETRON HCL 4 MG/2ML IJ SOLN
INTRAMUSCULAR | Status: DC | PRN
  Administered 2024-09-19: 4 mg via INTRAVENOUS

## 2024-09-19 MED ORDER — CHLORHEXIDINE GLUCONATE 0.12 % MT SOLN
15.0000 mL | Freq: Once | OROMUCOSAL | Status: AC
  Administered 2024-09-19: 15 mL via OROMUCOSAL

## 2024-09-19 MED ORDER — APIXABAN 2.5 MG PO TABS: 2.5000 mg | ORAL_TABLET | Freq: Two times a day (BID) | ORAL | 0 refills | Status: AC

## 2024-09-19 MED ORDER — HYDROMORPHONE HCL 1 MG/ML IJ SOLN
INTRAMUSCULAR | Status: AC
  Filled 2024-09-19: qty 1

## 2024-09-19 MED ORDER — PHENYLEPHRINE HCL (PRESSORS) 10 MG/ML IV SOLN
INTRAVENOUS | Status: DC | PRN
  Administered 2024-09-19: 80 ug via INTRAVENOUS

## 2024-09-19 MED ORDER — LIDOCAINE 2% (20 MG/ML) 5 ML SYRINGE
INTRAMUSCULAR | Status: AC
  Filled 2024-09-19: qty 5

## 2024-09-19 MED ORDER — 0.9 % SODIUM CHLORIDE (POUR BTL) OPTIME
TOPICAL | Status: DC | PRN
  Administered 2024-09-19: 1000 mL

## 2024-09-19 MED ORDER — HYDROMORPHONE HCL 1 MG/ML IJ SOLN
0.2500 mg | INTRAMUSCULAR | Status: DC | PRN
  Administered 2024-09-19 (×3): 0.5 mg via INTRAVENOUS

## 2024-09-19 MED ORDER — AMISULPRIDE (ANTIEMETIC) 5 MG/2ML IV SOLN: 10.0000 mg | Freq: Once | INTRAVENOUS | Status: DC | PRN

## 2024-09-19 MED ORDER — POVIDONE-IODINE 10 % EX SWAB
2.0000 | Freq: Once | CUTANEOUS | Status: AC
  Administered 2024-09-19: 2 via TOPICAL

## 2024-09-19 MED ORDER — CEFAZOLIN SODIUM-DEXTROSE 2-4 GM/100ML-% IV SOLN
2.0000 g | INTRAVENOUS | Status: AC
  Administered 2024-09-19: 2 g via INTRAVENOUS

## 2024-09-19 MED ORDER — PROPOFOL 10 MG/ML IV BOLUS
INTRAVENOUS | Status: DC | PRN
  Administered 2024-09-19: 200 mg via INTRAVENOUS

## 2024-09-19 MED ORDER — TRANEXAMIC ACID 1000 MG/10ML IV SOLN
2000.0000 mg | INTRAVENOUS | Status: DC
  Filled 2024-09-19: qty 20

## 2024-09-19 MED ORDER — FENTANYL CITRATE (PF) 100 MCG/2ML IJ SOLN
INTRAMUSCULAR | Status: DC | PRN
  Administered 2024-09-19 (×2): 50 ug via INTRAVENOUS

## 2024-09-19 MED ORDER — MIDAZOLAM HCL 5 MG/5ML IJ SOLN
INTRAMUSCULAR | Status: DC | PRN
  Administered 2024-09-19: 2 mg via INTRAVENOUS

## 2024-09-19 MED ORDER — CHLORHEXIDINE GLUCONATE 0.12 % MT SOLN
OROMUCOSAL | Status: AC
  Filled 2024-09-19: qty 15

## 2024-09-19 MED ORDER — MIDAZOLAM HCL 2 MG/2ML IJ SOLN
INTRAMUSCULAR | Status: AC
  Filled 2024-09-19: qty 2

## 2024-09-19 MED ORDER — TRANEXAMIC ACID-NACL 1000-0.7 MG/100ML-% IV SOLN
INTRAVENOUS | Status: AC
  Filled 2024-09-19: qty 100

## 2024-09-19 MED ORDER — ROCURONIUM BROMIDE 10 MG/ML (PF) SYRINGE
PREFILLED_SYRINGE | INTRAVENOUS | Status: AC
  Filled 2024-09-19: qty 10

## 2024-09-19 MED ORDER — SUCCINYLCHOLINE CHLORIDE 200 MG/10ML IV SOSY
PREFILLED_SYRINGE | INTRAVENOUS | Status: DC | PRN
  Administered 2024-09-19: 200 mg via INTRAVENOUS

## 2024-09-19 MED ORDER — ACETAMINOPHEN 10 MG/ML IV SOLN
1000.0000 mg | Freq: Once | INTRAVENOUS | Status: AC
  Administered 2024-09-19: 1000 mg via INTRAVENOUS

## 2024-09-19 MED ORDER — ROCURONIUM BROMIDE 100 MG/10ML IV SOLN
INTRAVENOUS | Status: DC | PRN
  Administered 2024-09-19: 50 mg via INTRAVENOUS

## 2024-09-19 MED ORDER — INSULIN ASPART 100 UNIT/ML IJ SOLN: 0.0000 [IU] | INTRAMUSCULAR | Status: DC | PRN

## 2024-09-19 MED ORDER — LIDOCAINE HCL (CARDIAC) PF 100 MG/5ML IV SOSY
PREFILLED_SYRINGE | INTRAVENOUS | Status: DC | PRN
  Administered 2024-09-19: 60 mg via INTRAVENOUS

## 2024-09-19 MED ORDER — OXYCODONE HCL 5 MG/5ML PO SOLN: 5.0000 mg | Freq: Once | ORAL | Status: DC | PRN

## 2024-09-19 MED ORDER — SUGAMMADEX SODIUM 200 MG/2ML IV SOLN
INTRAVENOUS | Status: DC | PRN
  Administered 2024-09-19: 300 mg via INTRAVENOUS

## 2024-09-19 MED ORDER — FENTANYL CITRATE (PF) 100 MCG/2ML IJ SOLN
50.0000 ug | Freq: Once | INTRAMUSCULAR | Status: AC
  Administered 2024-09-19: 50 ug via INTRAVENOUS

## 2024-09-19 MED ORDER — ACETAMINOPHEN 10 MG/ML IV SOLN
INTRAVENOUS | Status: AC
  Filled 2024-09-19: qty 100

## 2024-09-19 MED ORDER — OXYCODONE HCL 5 MG PO TABS: 5.0000 mg | ORAL_TABLET | Freq: Once | ORAL | Status: DC | PRN

## 2024-09-19 MED ORDER — LACTATED RINGERS IV SOLN: INTRAVENOUS | Status: DC

## 2024-09-19 NOTE — Plan of Care (Signed)
  Problem: Education: Goal: Ability to describe self-care measures that may prevent or decrease complications (Diabetes Survival Skills Education) will improve Outcome: Progressing Goal: Individualized Educational Video(s) Outcome: Progressing   Problem: Coping: Goal: Ability to adjust to condition or change in health will improve Outcome: Progressing   Problem: Fluid Volume: Goal: Ability to maintain a balanced intake and output will improve Outcome: Progressing   Problem: Health Behavior/Discharge Planning: Goal: Ability to identify and utilize available resources and services will improve Outcome: Progressing Goal: Ability to manage health-related needs will improve Outcome: Progressing   Problem: Metabolic: Goal: Ability to maintain appropriate glucose levels will improve Outcome: Progressing   Problem: Nutritional: Goal: Maintenance of adequate nutrition will improve Outcome: Progressing Goal: Progress toward achieving an optimal weight will improve Outcome: Progressing   Problem: Skin Integrity: Goal: Risk for impaired skin integrity will decrease Outcome: Progressing   Problem: Tissue Perfusion: Goal: Adequacy of tissue perfusion will improve Outcome: Progressing   Problem: Education: Goal: Knowledge of General Education information will improve Description: Including pain rating scale, medication(s)/side effects and non-pharmacologic comfort measures Outcome: Progressing   Problem: Health Behavior/Discharge Planning: Goal: Ability to manage health-related needs will improve Outcome: Progressing   Problem: Clinical Measurements: Goal: Ability to maintain clinical measurements within normal limits will improve Outcome: Progressing Goal: Will remain free from infection Outcome: Progressing Goal: Diagnostic test results will improve Outcome: Progressing Goal: Respiratory complications will improve Outcome: Progressing Goal: Cardiovascular complication will  be avoided Outcome: Progressing   Problem: Activity: Goal: Risk for activity intolerance will decrease Outcome: Progressing   Problem: Nutrition: Goal: Adequate nutrition will be maintained Outcome: Progressing   Problem: Coping: Goal: Level of anxiety will decrease Outcome: Progressing   Problem: Elimination: Goal: Will not experience complications related to bowel motility Outcome: Progressing Goal: Will not experience complications related to urinary retention Outcome: Progressing   Problem: Pain Managment: Goal: General experience of comfort will improve and/or be controlled Outcome: Progressing   Problem: Safety: Goal: Ability to remain free from injury will improve Outcome: Progressing   Problem: Skin Integrity: Goal: Risk for impaired skin integrity will decrease Outcome: Progressing   Problem: Education: Goal: Verbalization of understanding the information provided (i.e., activity precautions, restrictions, etc) will improve Outcome: Progressing Goal: Individualized Educational Video(s) Outcome: Progressing   Problem: Activity: Goal: Ability to ambulate and perform ADLs will improve Outcome: Progressing   Problem: Clinical Measurements: Goal: Postoperative complications will be avoided or minimized Outcome: Progressing   Problem: Self-Concept: Goal: Ability to maintain and perform role responsibilities to the fullest extent possible will improve Outcome: Progressing   Problem: Pain Management: Goal: Pain level will decrease Outcome: Progressing

## 2024-09-19 NOTE — Anesthesia Postprocedure Evaluation (Signed)
"   Anesthesia Post Note  Patient: Jose Summers  Procedure(s) Performed: FIXATION, FRACTURE, INTERTROCHANTERIC, WITH INTRAMEDULLARY ROD (Left)     Patient location during evaluation: PACU Anesthesia Type: General Level of consciousness: awake and alert, oriented and patient cooperative Pain management: pain level controlled Vital Signs Assessment: post-procedure vital signs reviewed and stable Respiratory status: spontaneous breathing, nonlabored ventilation and respiratory function stable Cardiovascular status: blood pressure returned to baseline and stable Postop Assessment: no apparent nausea or vomiting Anesthetic complications: no   No notable events documented.  Last Vitals:  Vitals:   09/19/24 0758 09/19/24 1036  BP: 125/84 133/88  Pulse: 71 71  Resp: 16 17  Temp: 36.7 C 37.3 C  SpO2: 97% 96%    Last Pain:  Vitals:   09/19/24 1330  TempSrc:   PainSc: 7    Pain Goal:                   Jose Summers      "

## 2024-09-19 NOTE — Op Note (Signed)
 "  Date of Surgery: 09/19/2024  INDICATIONS: Mr. Jose Summers is a 63 y.o.-year-old male who sustained a left hip fracture. The risks and benefits of the procedure discussed with the patient prior to the procedure and all questions were answered; consent was obtained.  PREOPERATIVE DIAGNOSIS: left intertrochanteric hip fracture   POSTOPERATIVE DIAGNOSIS: Same   PROCEDURE: Open treatment of intertrochanteric, pertrochanteric, subtrochanteric fracture with intramedullary implant. CPT [phone]   SURGEON: N. Ozell Cummins, M.D.   ASSIST: Ronal Morna Grave, [place]  ANESTHESIA: general   IV FLUIDS AND URINE: See anesthesia record   ESTIMATED BLOOD LOSS: 150 cc  IMPLANTS: [employer] InterTAN 10 x 180, 105/100 lag screws, 35 mm distal interlocking screw  DRAINS: None.   COMPLICATIONS: see description of procedure.   DESCRIPTION OF PROCEDURE: The patient was brought to the operating room and placed supine on the operating table. The patient's leg had been signed prior to the procedure. The patient had the anesthesia placed by the anesthesiologist. The prep verification and incision time-outs were performed to confirm that this was the correct patient, site, side and location. The patient had an SCD on the opposite lower extremity. The patient did receive antibiotics prior to the incision and was re-dosed during the procedure as needed at indicated intervals. The patient was positioned on the HANA table with the leg in traction and internal rotation to reduce the hip. The well leg was placed in a scissor position and all bony prominences were well-padded. The patient had the lower extremity prepped and draped in the standard surgical fashion. The incision was made 4 finger breadths superior to the greater trochanter. A guide pin was inserted into the tip of the greater trochanter under fluoroscopic guidance. An opening reamer was used to gain access to the femoral canal. The nail was inserted down the  femoral canal to its proper depth. The appropriate version of insertion for the lag screw was found under fluoroscopy. A pin was inserted up the femoral neck through the jig.  The length of the lag screw was then measured.  Sequential reamers were used.  Antirotation bar was inserted.  The lag screw was inserted as near to center-center in the head as possible. The antirotation bar was then taken out and an interdigitating compression screw was placed in its place. The leg was taken out of traction, then the interdigitating compression screw was used to compress across the fracture. Compression was visualized on serial xrays.  The set screw was tightened and then backed off half a turn.  A distal interlocking screw was placed through the static slot of the jig.  The wounds were copiously irrigated with saline and the subcutaneous layer closed with 2.0 vicryl and the skin was reapproximated with staples. The wounds were cleaned and dried a final time and a sterile dressing was placed. The hip was taken through a range of motion at the end of the case under fluoroscopic imaging to visualize the approach-withdraw phenomenon and confirm implant length in the head. The patient was then awakened from anesthesia and taken to the recovery room in stable condition. All counts were correct at the end of the case.   Morna Grave was necessary for opening, closing, retracting, limb positioning and overall facilitation and completion of the surgery.  POSTOPERATIVE PLAN: The patient will be weight bearing as tolerated and will return in 2 weeks for staple removal and the patient will receive DVT prophylaxis based on other medications, activity level, and risk ratio of  bleeding to thrombosis.   GEANNIE Ozell Cummins, MD [facility] 12:52 PM   "

## 2024-09-19 NOTE — H&P (Signed)

## 2024-09-19 NOTE — Transfer of Care (Signed)
 Immediate Anesthesia Transfer of Care Note  Patient: Jose Summers  Procedure(s) Performed: FIXATION, FRACTURE, INTERTROCHANTERIC, WITH INTRAMEDULLARY ROD (Left)  Patient Location: PACU  Anesthesia Type:General  Level of Consciousness: alert  and oriented  Airway & Oxygen Therapy: Patient Spontanous Breathing  Post-op Assessment: Report given to RN and Post -op Vital signs reviewed and stable  Post vital signs: Reviewed and stable  Last Vitals:  Vitals Value Taken Time  BP 116/72 09/19/24 13:22  Temp    Pulse 73 09/19/24 13:26  Resp 16 09/19/24 13:26  SpO2 95 % 09/19/24 13:26  Vitals shown include unfiled device data.  Last Pain:  Vitals:   09/19/24 1202  TempSrc:   PainSc: 8          Complications: No notable events documented.

## 2024-09-19 NOTE — Consult Note (Signed)
 "  ORTHOPAEDIC CONSULTATION  REQUESTING PHYSICIAN: Juvenal Harlene PENNER, DO  Chief Complaint: Left intertrochanteric fracture  HPI: Jose Summers is a 63 y.o. male with medical history significant of obese type II, hyperlipidemia, hypertension, chronic hyponatremia, psoriasis    According to the patient's  daughter, her father went to the grocery store and he was attempted to get back into his truck. The patient however twisted into an awkward position and as result fell backwards onto his left hip. He immediately started to experience left hip pain and hence the decision to come to the hospital for further evaluation and treatment. Ortho consulted for surgical evaluation.  Past Medical History:  Diagnosis Date   Diabetes mellitus without complication (HCC)    Hyperlipidemia    Hypertension    Psoriasis    Past Surgical History:  Procedure Laterality Date   KNEE SURGERY Right    Social History   Socioeconomic History   Marital status: Married    Spouse name: Not on file   Number of children: Not on file   Years of education: Not on file   Highest education level: Not on file  Occupational History   Not on file  Tobacco Use   Smoking status: Never    Passive exposure: Never   Smokeless tobacco: Never  Vaping Use   Vaping status: Never Used  Substance and Sexual Activity   Alcohol use: Not Currently   Drug use: Not Currently   Sexual activity: Not on file  Other Topics Concern   Not on file  Social History Narrative   Not on file   Social Drivers of Health   Tobacco Use: Low Risk (09/18/2024)   Patient History    Smoking Tobacco Use: Never    Smokeless Tobacco Use: Never    Passive Exposure: Never  Financial Resource Strain: Not on file  Food Insecurity: No Food Insecurity (09/19/2024)   Epic    Worried About Programme Researcher, Broadcasting/film/video in the Last Year: Never true    Ran Out of Food in the Last Year: Never true  Transportation Needs: No Transportation Needs  (09/19/2024)   Epic    Lack of Transportation (Medical): No    Lack of Transportation (Non-Medical): No  Physical Activity: Not on file  Stress: Not on file  Social Connections: Not on file  Depression (PHQ2-9): Low Risk (12/27/2023)   Depression (PHQ2-9)    PHQ-2 Score: 0  Alcohol Screen: Not on file  Housing: Low Risk (09/19/2024)   Epic    Unable to Pay for Housing in the Last Year: No    Number of Times Moved in the Last Year: 0    Homeless in the Last Year: No  Utilities: Not At Risk (09/19/2024)   Epic    Threatened with loss of utilities: No  Health Literacy: Not on file   History reviewed. No pertinent family history. Allergies[1] Prior to Admission medications  Medication Sig Start Date End Date Taking? Authorizing Provider  amLODipine  (NORVASC ) 5 MG tablet Take 5 mg by mouth daily.    [provider]  atorvastatin  (LIPITOR) 40 MG tablet TAKE 1 TABLET EVERY DAY BY MOUTH AT BEDTIME FOR 90 DAYS. Patient not taking: Reported on 12/27/2023    [provider]  lisinopril  (ZESTRIL ) 40 MG tablet Take 40 mg by mouth daily.    [provider]  metFORMIN  (GLUCOPHAGE ) 850 MG tablet TAKE 1 TABLET TWICE A DAY BY MOUTH WITH MEALS FOR 90 DAYS.  [provider]   DG FEMUR MIN 2 VIEWS LEFT Result Date: 09/19/2024 CLINICAL DATA:  Known left hip fracture EXAM: LEFT FEMUR 2 VIEWS COMPARISON:  09/18/2024 FINDINGS: Left intratrochanteric fracture is again identified with mild impaction and angulation at the fracture site. Degenerative changes about the knee joint are seen. No soft tissue abnormality is noted. IMPRESSION: Proximal left femoral fracture similar to that seen on prior exam. Electronically Signed   By: Oneil Devonshire M.D.   On: 09/19/2024 02:05   DG Chest Portable 1 View Result Date: 09/18/2024 CLINICAL DATA:  Hip fracture EXAM: PORTABLE CHEST 1 VIEW COMPARISON:  09/22/2023 FINDINGS: Low lung volumes. Mild cardiomegaly with central bronchovascular  crowding. No acute airspace disease, pleural effusion or pneumothorax. Aortic atherosclerosis IMPRESSION: Low lung volumes with mild cardiomegaly. Electronically Signed   By: Luke Bun M.D.   On: 09/18/2024 20:00   DG Hip Unilat W or Wo Pelvis 2-3 Views Left Result Date: 09/18/2024 CLINICAL DATA:  Hip pain after fall. EXAM: DG HIP (WITH OR WITHOUT PELVIS) 2-3V LEFT COMPARISON:  None Available. FINDINGS: Comminuted and displaced intertrochanteric left proximal femur fracture. There is displacement involving the lesser and greater trochanter. No hip dislocation. Mild bilateral hip osteoarthritis. Intact bony pelvis. No pubic symphyseal or sacroiliac diastasis. IMPRESSION: Comminuted and displaced intertrochanteric left proximal femur fracture. Electronically Signed   By: Andrea Gasman M.D.   On: 09/18/2024 18:55    All pertinent xrays, MRI, CT independently reviewed and interpreted  Positive ROS: All other systems have been reviewed and were otherwise negative with the exception of those mentioned in the HPI and as above.  Physical Exam: General: No acute distress Cardiovascular: No pedal edema Respiratory: No cyanosis, no use of accessory musculature GI: No organomegaly, abdomen is soft and non-tender Skin: No lesions in the area of chief complaint Neurologic: Sensation intact distally Psychiatric: Patient is at baseline mood and affect Lymphatic: No axillary or cervical lymphadenopathy  MUSCULOSKELETAL:  - pain with movement of the hip and extremity - skin intact - NVI distally - compartments soft  Assessment: Left intertrochanteric femur fracture  Plan: - surgical treatment is recommended for pain relief, quality of life and early mobilization - patient and family are aware of r/b/a and wish to proceed, informed consent obtained - medical optimization per primary team - surgery is planned for today  Thank you for the consult and the opportunity to see Mr. Jose Summers.  Ozell Cummins, MD [facility] 9:36 AM         [1] No Known Allergies  "

## 2024-09-19 NOTE — Plan of Care (Signed)
  Problem: Health Behavior/Discharge Planning: Goal: Ability to manage health-related needs will improve Outcome: Progressing   Problem: Metabolic: Goal: Ability to maintain appropriate glucose levels will improve Outcome: Progressing   Problem: Nutritional: Goal: Maintenance of adequate nutrition will improve Outcome: Progressing   Problem: Tissue Perfusion: Goal: Adequacy of tissue perfusion will improve Outcome: Progressing   Problem: Education: Goal: Knowledge of General Education information will improve Description: Including pain rating scale, medication(s)/side effects and non-pharmacologic comfort measures Outcome: Progressing

## 2024-09-19 NOTE — Progress Notes (Signed)
 " PROGRESS NOTE    Jose Summers  FMW:[id number] DOB: 1960-11-26 DOA: 09/18/2024 PCP: Duanne Butler DASEN, MD    Brief Narrative:  Jose Summers is a 63 y.o. male with medical history significant of obese type II, hyperlipidemia, hypertension, chronic hyponatremia, psoriasis    According to the patient's  daughter, her father went to the grocery store and he was attempted to get back into his truck. The patient however twisted into an awkward position and as result fell backwards onto his left hip. He immediately started to experience left hip pain.  Found to have a fracture and plan is for the OR on 12/20   Assessment and Plan: Closed left fracture, initial encounter -Orthopedic surgery has been consulted:  planning on taking patient to the OR in the morning    Hyponatremia -Labs not done this a.m. - Recheck in the a.m.   Type 2 DM  -Sliding scale insulin    Hypertension - Was started on Norvasc  and lisinopril  - Monitor closely    HLD - continue statin therapy    DVT prophylaxis: Place and maintain sequential compression device Start: 09/18/24 2217    Code Status: Full Code Family Communication:   Disposition Plan:  Level of care: Med-Surg Status is: Inpatient     Consultants:  ortho   Subjective: No overnight events, no questions regarding surgery  Objective: Vitals:   09/19/24 0026 09/19/24 0402 09/19/24 0758 09/19/24 1036  BP: (!) 155/95 128/76 125/84 133/88  Pulse: 86 74 71 71  Resp: 18 18 16 17   Temp: 98.4 F (36.9 C) 97.8 F (36.6 C) 98 F (36.7 C) 99.2 F (37.3 C)  TempSrc: Oral Oral Oral   SpO2: 95% 95% 97% 96%  Weight:      Height:        Intake/Output Summary (Last 24 hours) at 09/19/2024 1054 Last data filed at 09/19/2024 0810 Gross per 24 hour  Intake --  Output 775 ml  Net -775 ml   Filed Weights   09/18/24 1753 09/19/24 0000  Weight: 108.9 kg 100.5 kg    Examination:   General: Appearance:    Obese male in no acute  distress     Lungs:    respirations unlabored  Heart:    Normal heart rate. Normal rhythm. No murmurs, rubs, or gallops.    MS:   All extremities are intact.    Neurologic:   Awake, alert       Data Reviewed: I have personally reviewed following labs and imaging studies  CBC: Recent Labs  Lab 09/18/24 1835  WBC 5.5  NEUTROABS 3.7  HGB 11.4*  HCT 35.0*  MCV 80.1  PLT 269   Basic Metabolic Panel: Recent Labs  Lab 09/18/24 1835  NA 130*  K 3.8  CL 93*  CO2 22  GLUCOSE 112*  BUN 15  CREATININE 0.93  CALCIUM  8.8*   GFR: Estimated Creatinine Clearance: 96.2 mL/min (by C-G formula based on SCr of 0.93 mg/dL). Liver Function Tests: No results for input(s): AST, ALT, ALKPHOS, BILITOT, PROT, ALBUMIN in the last 168 hours. No results for input(s): LIPASE, AMYLASE in the last 168 hours. No results for input(s): AMMONIA in the last 168 hours. Coagulation Profile: Recent Labs  Lab 09/18/24 1835  INR 1.0   Cardiac Enzymes: No results for input(s): CKTOTAL, CKMB, CKMBINDEX, TROPONINI in the last 168 hours. BNP (last 3 results) No results for input(s): PROBNP in the last 8760 hours. HbA1C: No results for input(s): HGBA1C in  the last 72 hours. CBG: Recent Labs  Lab 09/19/24 0637 09/19/24 1033  GLUCAP 122* 111*   Lipid Profile: No results for input(s): CHOL, HDL, LDLCALC, TRIG, CHOLHDL, LDLDIRECT in the last 72 hours. Thyroid  Function Tests: No results for input(s): TSH, T4TOTAL, FREET4, T3FREE, THYROIDAB in the last 72 hours. Anemia Panel: No results for input(s): VITAMINB12, FOLATE, FERRITIN, TIBC, IRON, RETICCTPCT in the last 72 hours. Sepsis Labs: No results for input(s): PROCALCITON, LATICACIDVEN in the last 168 hours.  Recent Results (from the past 240 hours)  Surgical pcr screen     Status: None   Collection Time: 09/19/24 12:38 AM   Specimen: Nasal Mucosa; Nasal Swab  Result Value Ref  Range Status   MRSA, PCR NEGATIVE NEGATIVE Final   Staphylococcus aureus NEGATIVE NEGATIVE Final    Comment: (NOTE) The Xpert SA Assay (FDA approved for NASAL specimens in patients 86 years of age and older), is one component of a comprehensive surveillance program. It is not intended to diagnose infection nor to guide or monitor treatment. Performed at [facility], [address], [place], [place] [address]          Radiology Studies: DG FEMUR MIN 2 VIEWS LEFT Result Date: 09/19/2024 CLINICAL DATA:  Known left hip fracture EXAM: LEFT FEMUR 2 VIEWS COMPARISON:  09/18/2024 FINDINGS: Left intratrochanteric fracture is again identified with mild impaction and angulation at the fracture site. Degenerative changes about the knee joint are seen. No soft tissue abnormality is noted. IMPRESSION: Proximal left femoral fracture similar to that seen on prior exam. Electronically Signed   By: Oneil Devonshire M.D.   On: 09/19/2024 02:05   DG Chest Portable 1 View Result Date: 09/18/2024 CLINICAL DATA:  Hip fracture EXAM: PORTABLE CHEST 1 VIEW COMPARISON:  09/22/2023 FINDINGS: Low lung volumes. Mild cardiomegaly with central bronchovascular crowding. No acute airspace disease, pleural effusion or pneumothorax. Aortic atherosclerosis IMPRESSION: Low lung volumes with mild cardiomegaly. Electronically Signed   By: Luke Bun M.D.   On: 09/18/2024 20:00   DG Hip Unilat W or Wo Pelvis 2-3 Views Left Result Date: 09/18/2024 CLINICAL DATA:  Hip pain after fall. EXAM: DG HIP (WITH OR WITHOUT PELVIS) 2-3V LEFT COMPARISON:  None Available. FINDINGS: Comminuted and displaced intertrochanteric left proximal femur fracture. There is displacement involving the lesser and greater trochanter. No hip dislocation. Mild bilateral hip osteoarthritis. Intact bony pelvis. No pubic symphyseal or sacroiliac diastasis. IMPRESSION: Comminuted and displaced intertrochanteric left proximal femur fracture. Electronically  Signed   By: Andrea Gasman M.D.   On: 09/18/2024 18:55        Scheduled Meds:  [MAR Hold] amLODipine   5 mg Oral Daily   chlorhexidine   15 mL Mouth/Throat Once   Or   mouth rinse  15 mL Mouth Rinse Once   [MAR Hold] docusate sodium   100 mg Oral BID   [MAR Hold] insulin  aspart  0-6 Units Subcutaneous TID WC   [MAR Hold] lisinopril   40 mg Oral Daily   povidone-iodine   2 Application Topical Once   [MAR Hold] senna  1 tablet Oral BID   tranexamic acid  (CYKLOKAPRON ) 2,000 mg in sodium chloride  0.9 % 50 mL Topical Application  2,000 mg Topical To OR   Continuous Infusions:   ceFAZolin  (ANCEF ) IV     lactated ringers      tranexamic acid        LOS: 1 day    Time spent: 45 minutes spent on chart review, discussion with nursing staff, consultants, updating family  and interview/physical exam; more than 50% of that time was spent in counseling and/or coordination of care.    Harlene RAYMOND Bowl, DO [facility] Available via Epic secure chat 7am-7pm After these hours, please refer to coverage provider listed on [email or web address] 09/19/2024, 10:54 AM   "

## 2024-09-19 NOTE — Anesthesia Preprocedure Evaluation (Addendum)
"                                    Anesthesia Evaluation  Patient identified by MRN, date of birth, ID band Patient awake    Reviewed: Allergy & Precautions, H&P , NPO status , Patient's Chart, lab work & pertinent test results  Airway Mallampati: IV  TM Distance: >3 FB Neck ROM: Full    Dental  (+) Dental Advisory Given, Poor Dentition   Pulmonary neg pulmonary ROS   Pulmonary exam normal breath sounds clear to auscultation       Cardiovascular hypertension, Pt. on medications Normal cardiovascular exam Rhythm:Regular Rate:Normal     Neuro/Psych negative neurological ROS  negative psych ROS   GI/Hepatic negative GI ROS, Neg liver ROS,,,  Endo/Other  diabetes, Well Controlled, Type 2, Oral Hypoglycemic Agents  BMI 33  Renal/GU negative Renal ROS  negative genitourinary   Musculoskeletal L intertroch fx   Abdominal  (+) + obese  Peds negative pediatric ROS (+)  Hematology  (+) Blood dyscrasia, anemia Hb 11.4, plt 269   Anesthesia Other Findings   Reproductive/Obstetrics negative OB ROS                              Anesthesia Physical Anesthesia Plan  ASA: 3  Anesthesia Plan: General   Post-op Pain Management: Ofirmev  IV (intra-op)*   Induction: Intravenous  PONV Risk Score and Plan: 2 and Ondansetron , Dexamethasone , Midazolam  and Treatment may vary due to age or medical condition  Airway Management Planned: Oral ETT and Video Laryngoscope Planned  Additional Equipment: None  Intra-op Plan:   Post-operative Plan: Extubation in OR  Informed Consent: I have reviewed the patients History and Physical, chart, labs and discussed the procedure including the risks, benefits and alternatives for the proposed anesthesia with the patient or authorized representative who has indicated his/her understanding and acceptance.     Dental advisory given  Plan Discussed with: CRNA  Anesthesia Plan Comments: (Mall4, no  prior intubation records)        Anesthesia Quick Evaluation  "

## 2024-09-20 LAB — CBC
HCT: 25.2 % — ABNORMAL LOW (ref 39.0–52.0)
Hemoglobin: 8.3 g/dL — ABNORMAL LOW (ref 13.0–17.0)
MCH: 26.3 pg (ref 26.0–34.0)
MCHC: 32.9 g/dL (ref 30.0–36.0)
MCV: 80 fL (ref 80.0–100.0)
Platelets: 205 K/uL (ref 150–400)
RBC: 3.15 MIL/uL — ABNORMAL LOW (ref 4.22–5.81)
RDW: 17.1 % — ABNORMAL HIGH (ref 11.5–15.5)
WBC: 9.4 K/uL (ref 4.0–10.5)
nRBC: 0 % (ref 0.0–0.2)

## 2024-09-20 LAB — GLUCOSE, CAPILLARY
Glucose-Capillary: 189 mg/dL — ABNORMAL HIGH (ref 70–99)
Glucose-Capillary: 174 mg/dL — ABNORMAL HIGH (ref 70–99)
Glucose-Capillary: 169 mg/dL — ABNORMAL HIGH (ref 70–99)
Glucose-Capillary: 167 mg/dL — ABNORMAL HIGH (ref 70–99)

## 2024-09-20 LAB — OSMOLALITY, URINE: Osmolality, Ur: 413 mosm/kg (ref 300–900)

## 2024-09-20 LAB — BASIC METABOLIC PANEL WITH GFR
Anion gap: 8 (ref 5–15)
Anion gap: 7 (ref 5–15)
BUN: 21 mg/dL (ref 8–23)
BUN: 19 mg/dL (ref 8–23)
CO2: 25 mmol/L (ref 22–32)
CO2: 27 mmol/L (ref 22–32)
Calcium: 8.2 mg/dL — ABNORMAL LOW (ref 8.9–10.3)
Calcium: 8.3 mg/dL — ABNORMAL LOW (ref 8.9–10.3)
Chloride: 92 mmol/L — ABNORMAL LOW (ref 98–111)
Chloride: 93 mmol/L — ABNORMAL LOW (ref 98–111)
Creatinine, Ser: 1.32 mg/dL — ABNORMAL HIGH (ref 0.61–1.24)
Creatinine, Ser: 1.1 mg/dL (ref 0.61–1.24)
GFR, Estimated: >60 mL/min
GFR, Estimated: >60 mL/min
Glucose, Bld: 171 mg/dL — ABNORMAL HIGH (ref 70–99)
Glucose, Bld: 121 mg/dL — ABNORMAL HIGH (ref 70–99)
Potassium: 4.7 mmol/L (ref 3.5–5.1)
Potassium: 4.4 mmol/L (ref 3.5–5.1)
Sodium: 125 mmol/L — ABNORMAL LOW (ref 135–145)
Sodium: 126 mmol/L — ABNORMAL LOW (ref 135–145)

## 2024-09-20 LAB — SODIUM, URINE, RANDOM: Sodium, Ur: <30 mmol/L

## 2024-09-20 MED ORDER — SODIUM CHLORIDE 0.9 % IV SOLN: INTRAVENOUS | Status: AC

## 2024-09-20 MED ORDER — LORATADINE 10 MG PO TABS
10.0000 mg | ORAL_TABLET | Freq: Every day | ORAL | Status: DC
  Administered 2024-09-21 – 2024-09-28 (×8): 10 mg via ORAL
  Filled 2024-09-20 (×8): qty 1

## 2024-09-20 MED ORDER — SALINE SPRAY 0.65 % NA SOLN
1.0000 | NASAL | Status: DC | PRN
  Administered 2024-09-20: 1 via NASAL
  Filled 2024-09-20: qty 44

## 2024-09-20 NOTE — Progress Notes (Signed)
 " PROGRESS NOTE    Jose Summers  FMW:[id number] DOB: 1961-09-28 DOA: 09/18/2024 PCP: Duanne Butler DASEN, MD    Brief Narrative:  Jose Summers is a 63 y.o. male with medical history significant of obese type II, hyperlipidemia, hypertension, chronic hyponatremia, psoriasis    According to the patient's  daughter, her father went to the grocery store and he was attempted to get back into his truck. The patient however twisted into an awkward position and as result fell backwards onto his left hip. He immediately started to experience left hip pain.  Found to have a fracture of the left hip. He underwent ORIF on 09/19/2024. He has tolerated the procedure well.   PT/OT are ordered He is WBAT on the left lower extremity. Will restart eliquis . He will need to follow up with orthopedic surgery 2 weeks post-op.   Assessment and Plan: Closed left fracture, initial encounter --The patient is s/p intramedullary rod placement for closed fracture of the left hip. -- WBAT --PT/OT --anticipate rehab placement.     Hyponatremia -Na 125 down from 130 on admission. Likely due to pain and volume depletion. Will start IV fluids. --Urine sodium and osmolality have been ordered --Recheck sodium at 1800.   Type 2 DM  -Sliding scale insulin    Hypertension - Was started on Norvasc  and lisinopril  - Monitor closely    HLD - continue statin therapy    DVT prophylaxis: Place and maintain sequential compression device Start: 09/18/24 2217    Code Status: Full Code Family Communication:   Disposition Plan:  Level of care: Med-Surg Status is: Inpatient     Consultants:  ortho   Subjective: The patient is resting comfortably. No new complaints.   Objective: Vitals:   09/19/24 2015 09/19/24 2331 09/20/24 0519 09/20/24 0736  BP: 104/68 94/61 (!) 123/100 107/67  Pulse: 87 82 76 74  Resp: 19 19 20 16   Temp: 97.6 F (36.4 C) 98.1 F (36.7 C) 98.4 F (36.9 C) 98.4 F (36.9 C)   TempSrc: Oral Oral Oral   SpO2: 98% 97% 99% 92%  Weight:      Height:        Intake/Output Summary (Last 24 hours) at 09/20/2024 1334 Last data filed at 09/20/2024 0400 Gross per 24 hour  Intake --  Output 300 ml  Net -300 ml   Filed Weights   09/18/24 1753 09/19/24 0000  Weight: 108.9 kg 100.5 kg    Examination:   Exam:  Constitutional:  The patient is awake, alert, and oriented x 3. No acute distress. Respiratory:  No increased work of breathing. No wheezes, rales, or rhonchi No tactile fremitus Cardiovascular:  Regular rate and rhythm No murmurs, ectopy, or gallups. No lateral PMI. No thrills. Abdomen:  Abdomen is soft, non-tender, non-distended No hernias, masses, or organomegaly Normoactive bowel sounds.  Musculoskeletal:  No cyanosis, clubbing, or edema Skin:  No rashes, lesions, ulcers palpation of skin: no induration or nodules Neurologic:  CN 2-12 intact Sensation all 4 extremities intact Psychiatric:  Mental status Mood, affect appropriate Orientation to person, place, time  judgment and insight appear intact      Data Reviewed: I have personally reviewed following labs and imaging studies  CBC: Recent Labs  Lab 09/18/24 1835 09/20/24 0604  WBC 5.5 9.4  NEUTROABS 3.7  --   HGB 11.4* 8.3*  HCT 35.0* 25.2*  MCV 80.1 80.0  PLT 269 205   Basic Metabolic Panel: Recent Labs  Lab 09/18/24 1835 09/20/24 0604  NA 130* 125*  K 3.8 4.7  CL 93* 92*  CO2 22 25  GLUCOSE 112* 171*  BUN 15 21  CREATININE 0.93 1.32*  CALCIUM  8.8* 8.2*   GFR: Estimated Creatinine Clearance: 67.8 mL/min (A) (by C-G formula based on SCr of 1.32 mg/dL (H)). Liver Function Tests: No results for input(s): AST, ALT, ALKPHOS, BILITOT, PROT, ALBUMIN in the last 168 hours. No results for input(s): LIPASE, AMYLASE in the last 168 hours. No results for input(s): AMMONIA in the last 168 hours. Coagulation Profile: Recent Labs  Lab  09/18/24 1835  INR 1.0   Cardiac Enzymes: No results for input(s): CKTOTAL, CKMB, CKMBINDEX, TROPONINI in the last 168 hours. BNP (last 3 results) No results for input(s): PROBNP in the last 8760 hours. HbA1C: No results for input(s): HGBA1C in the last 72 hours. CBG: Recent Labs  Lab 09/19/24 1322 09/19/24 1625 09/19/24 2114 09/20/24 0517 09/20/24 1148  GLUCAP 162* 137* 177* 189* 174*   Lipid Profile: No results for input(s): CHOL, HDL, LDLCALC, TRIG, CHOLHDL, LDLDIRECT in the last 72 hours. Thyroid  Function Tests: No results for input(s): TSH, T4TOTAL, FREET4, T3FREE, THYROIDAB in the last 72 hours. Anemia Panel: No results for input(s): VITAMINB12, FOLATE, FERRITIN, TIBC, IRON, RETICCTPCT in the last 72 hours. Sepsis Labs: No results for input(s): PROCALCITON, LATICACIDVEN in the last 168 hours.  Recent Results (from the past 240 hours)  Surgical pcr screen     Status: None   Collection Time: 09/19/24 12:38 AM   Specimen: Nasal Mucosa; Nasal Swab  Result Value Ref Range Status   MRSA, PCR NEGATIVE NEGATIVE Final   Staphylococcus aureus NEGATIVE NEGATIVE Final    Comment: (NOTE) The Xpert SA Assay (FDA approved for NASAL specimens in patients 6 years of age and older), is one component of a comprehensive surveillance program. It is not intended to diagnose infection nor to guide or monitor treatment. Performed at [facility], [address] [place], [place] [address]          Radiology Studies: DG HIP PORT UNILAT WITH PELVIS 1V LEFT Result Date: 09/19/2024 EXAM: 1 VIEW(S) XRAY OF THE PELVIS AND LEFT HIP 09/19/2024 10:55:00 PM COMPARISON: None available. CLINICAL HISTORY: Deficient knowledge of open reduction and internal (ORIF) fixation of hip. FINDINGS: BONES AND JOINTS: SI joints are symmetric. Intramedullary rod and screw fixation across an intertrochanteric fracture of the left femur. The right hip  demonstrates normal alignment. SOFT TISSUES: The soft tissues are unremarkable. IMPRESSION: 1. Intramedullary rod and screw fixation across an intertrochanteric fracture of the left femur. Electronically signed by: Norman Gatlin MD 09/19/2024 10:59 PM EST RP Workstation: [id number]   DG HIP UNILAT WITH PELVIS 2-3 VIEWS LEFT Result Date: 09/19/2024 CLINICAL DATA:  FT surgery.  Left femoral ORIF. EXAM: DG HIP (WITH OR WITHOUT PELVIS) 2-3V*L* COMPARISON:  Left hip radiograph dated 09/19/2024. FINDINGS: Intraoperative fluoroscopic spot images of the left hip provided. The total fluoroscopic time is 75.1 seconds with cumulative air kerma of 33.34 mGy. Status post left femoral ORIF. IMPRESSION: Intraoperative fluoroscopic spot images of the left hip. Electronically Signed   By: Vanetta Chou M.D.   On: 09/19/2024 14:09   DG C-Arm 1-60 Min-No Report Result Date: 09/19/2024 Fluoroscopy was utilized by the requesting physician.  No radiographic interpretation.   DG FEMUR MIN 2 VIEWS LEFT Result Date: 09/19/2024 CLINICAL DATA:  Known left hip fracture EXAM: LEFT FEMUR 2 VIEWS COMPARISON:  09/18/2024 FINDINGS: Left intratrochanteric fracture is again identified with mild impaction and  angulation at the fracture site. Degenerative changes about the knee joint are seen. No soft tissue abnormality is noted. IMPRESSION: Proximal left femoral fracture similar to that seen on prior exam. Electronically Signed   By: Oneil Devonshire M.D.   On: 09/19/2024 02:05   DG Chest Portable 1 View Result Date: 09/18/2024 CLINICAL DATA:  Hip fracture EXAM: PORTABLE CHEST 1 VIEW COMPARISON:  09/22/2023 FINDINGS: Low lung volumes. Mild cardiomegaly with central bronchovascular crowding. No acute airspace disease, pleural effusion or pneumothorax. Aortic atherosclerosis IMPRESSION: Low lung volumes with mild cardiomegaly. Electronically Signed   By: Luke Bun M.D.   On: 09/18/2024 20:00   DG Hip Unilat W or Wo Pelvis 2-3 Views  Left Result Date: 09/18/2024 CLINICAL DATA:  Hip pain after fall. EXAM: DG HIP (WITH OR WITHOUT PELVIS) 2-3V LEFT COMPARISON:  None Available. FINDINGS: Comminuted and displaced intertrochanteric left proximal femur fracture. There is displacement involving the lesser and greater trochanter. No hip dislocation. Mild bilateral hip osteoarthritis. Intact bony pelvis. No pubic symphyseal or sacroiliac diastasis. IMPRESSION: Comminuted and displaced intertrochanteric left proximal femur fracture. Electronically Signed   By: Andrea Gasman M.D.   On: 09/18/2024 18:55   Scheduled Meds:  amLODipine   5 mg Oral Daily   docusate sodium   100 mg Oral BID   insulin  aspart  0-6 Units Subcutaneous TID WC   lisinopril   40 mg Oral Daily   senna  1 tablet Oral BID   Continuous Infusions:NS at 125     LOS: 2 days    Time spent: 42 minutes spent on chart review, discussion with nursing staff, consultants, updating family and interview/physical exam; more than 50% of that time was spent in counseling and/or coordination of care.    Immaculate Crutcher, DO [facility] Available via Epic secure chat 7am-7pm After these hours, please refer to coverage provider listed on [email or web address] 09/20/2024, 1:34 PM   "

## 2024-09-20 NOTE — Discharge Instructions (Addendum)
 Follow up with orthopedic surgery within 1 week Follow up with PCP within 1 week     1. Change dressings as needed 2. May shower but keep incisions covered and dry 3. Take your prescribed blood thinner to prevent blood clots 4. Take stool softeners as needed 5. Take pain meds as needed  I have reviewed the patient's history and given the presence of a fragility fracture, I have deemed the necessity of a osteoporosis management referral or confirmed that the patient is currently enrolled in a osteoporosis treatment program.

## 2024-09-20 NOTE — Progress Notes (Signed)
 Subjective: 1 Day Post-Op Procedures (LRB): FIXATION, FRACTURE, INTERTROCHANTERIC, WITH INTRAMEDULLARY ROD (Left) Patient reports pain as mild.    Objective: Vital signs in last 24 hours: Temp:  [97.6 F (36.4 C)-98.4 F (36.9 C)] 98.4 F (36.9 C) (12/21 0736) Pulse Rate:  [70-88] 74 (12/21 0736) Resp:  [11-20] 16 (12/21 0736) BP: (94-124)/(61-100) 107/67 (12/21 0736) SpO2:  [89 %-99 %] 92 % (12/21 0736)  Intake/Output from previous day: 12/20 0701 - 12/21 0700 In: -  Out: 700 [Urine:650; Blood:50] Intake/Output this shift: No intake/output data recorded.  Recent Labs    09/18/24 1835 09/20/24 0604  HGB 11.4* 8.3*   Recent Labs    09/18/24 1835 09/20/24 0604  WBC 5.5 9.4  RBC 4.37 3.15*  HCT 35.0* 25.2*  PLT 269 205   Recent Labs    09/18/24 1835 09/20/24 0604  NA 130* 125*  K 3.8 4.7  CL 93* 92*  CO2 22 25  BUN 15 21  CREATININE 0.93 1.32*  GLUCOSE 112* 171*  CALCIUM  8.8* 8.2*   Recent Labs    09/18/24 1835  INR 1.0    Neurologically intact Neurovascular intact Sensation intact distally Intact pulses distally Dorsiflexion/Plantar flexion intact Incision: dressing C/D/I No cellulitis present Compartment soft   Assessment/Plan: 1 Day Post-Op Procedures (LRB): FIXATION, FRACTURE, INTERTROCHANTERIC, WITH INTRAMEDULLARY ROD (Left) Up with therapy WBAT LLE Post-op pain control- norco on chart May resume eliquis  F/u with ortho two weeks post-op      Ronal LITTIE Grave 09/20/2024, 1:13 PM

## 2024-09-20 NOTE — Plan of Care (Signed)
  Problem: Coping: Goal: Ability to adjust to condition or change in health will improve Outcome: Progressing   Problem: Pain Managment: Goal: General experience of comfort will improve and/or be controlled Outcome: Progressing   Problem: Safety: Goal: Ability to remain free from injury will improve Outcome: Progressing

## 2024-09-21 ENCOUNTER — Encounter (HOSPITAL_COMMUNITY): Payer: Self-pay | Admitting: Orthopaedic Surgery

## 2024-09-21 LAB — CBC WITH DIFFERENTIAL/PLATELET
Abs Immature Granulocytes: 0.04 K/uL (ref 0.00–0.07)
Basophils Absolute: 0 K/uL (ref 0.0–0.1)
Basophils Relative: 0 %
Eosinophils Absolute: 0.1 K/uL (ref 0.0–0.5)
Eosinophils Relative: 1 %
HCT: 21.3 % — ABNORMAL LOW (ref 39.0–52.0)
Hemoglobin: 7.1 g/dL — ABNORMAL LOW (ref 13.0–17.0)
Immature Granulocytes: 0 %
Lymphocytes Relative: 11 %
Lymphs Abs: 1 K/uL (ref 0.7–4.0)
MCH: 26.8 pg (ref 26.0–34.0)
MCHC: 33.3 g/dL (ref 30.0–36.0)
MCV: 80.4 fL (ref 80.0–100.0)
Monocytes Absolute: 0.9 K/uL (ref 0.1–1.0)
Monocytes Relative: 10 %
Neutro Abs: 7.1 K/uL (ref 1.7–7.7)
Neutrophils Relative %: 78 %
Platelets: 190 K/uL (ref 150–400)
RBC: 2.65 MIL/uL — ABNORMAL LOW (ref 4.22–5.81)
RDW: 16.7 % — ABNORMAL HIGH (ref 11.5–15.5)
WBC: 9.2 K/uL (ref 4.0–10.5)
nRBC: 0 % (ref 0.0–0.2)

## 2024-09-21 LAB — BASIC METABOLIC PANEL WITH GFR
Anion gap: 8 (ref 5–15)
Anion gap: 8 (ref 5–15)
BUN: 19 mg/dL (ref 8–23)
BUN: 17 mg/dL (ref 8–23)
CO2: 26 mmol/L (ref 22–32)
CO2: 28 mmol/L (ref 22–32)
Calcium: 8.2 mg/dL — ABNORMAL LOW (ref 8.9–10.3)
Calcium: 8.8 mg/dL — ABNORMAL LOW (ref 8.9–10.3)
Chloride: 94 mmol/L — ABNORMAL LOW (ref 98–111)
Chloride: 93 mmol/L — ABNORMAL LOW (ref 98–111)
Creatinine, Ser: 1.08 mg/dL (ref 0.61–1.24)
Creatinine, Ser: 0.82 mg/dL (ref 0.61–1.24)
GFR, Estimated: >60 mL/min
GFR, Estimated: >60 mL/min
Glucose, Bld: 144 mg/dL — ABNORMAL HIGH (ref 70–99)
Glucose, Bld: 136 mg/dL — ABNORMAL HIGH (ref 70–99)
Potassium: 4.5 mmol/L (ref 3.5–5.1)
Potassium: 4.4 mmol/L (ref 3.5–5.1)
Sodium: 128 mmol/L — ABNORMAL LOW (ref 135–145)
Sodium: 129 mmol/L — ABNORMAL LOW (ref 135–145)

## 2024-09-21 LAB — GLUCOSE, CAPILLARY
Glucose-Capillary: 128 mg/dL — ABNORMAL HIGH (ref 70–99)
Glucose-Capillary: 167 mg/dL — ABNORMAL HIGH (ref 70–99)
Glucose-Capillary: 150 mg/dL — ABNORMAL HIGH (ref 70–99)
Glucose-Capillary: 191 mg/dL — ABNORMAL HIGH (ref 70–99)

## 2024-09-21 NOTE — Evaluation (Signed)
 Occupational Therapy Evaluation Patient Details Name: Jose Summers MRN: [id number] DOB: September 08, 1961 Today's Date: 09/21/2024   History of Present Illness   Jose Summers is a 63 y.o. male admitted 12/19 after injuring left hip resulting in left hip fx.  ORIF 12/20.  PMH: obese type II, hyperlipidemia, hypertension, chronic hyponatremia, psoriasis     Clinical Impressions Jose Summers was evaluated s/p the above admission list. He is indep and lives with family at baseline. Upon evaluation the pt was limited by LLE pain, weakness and limited activity tolerance. Overall he needs max A for bed mobility and did want to progress to standing. Due to the deficits listed below the pt also needs up to max A for LB Adls and set up A for UB ADLs - he will likely benefit form AE education. Pt will benefit from continued acute OT services and skilled inpatient follow up therapy, <3 hours/day.      If plan is discharge home, recommend the following:   A lot of help with walking and/or transfers;A lot of help with bathing/dressing/bathroom;Assistance with cooking/housework;Assist for transportation;Help with stairs or ramp for entrance     Functional Status Assessment   Patient has had a recent decline in their functional status and demonstrates the ability to make significant improvements in function in a reasonable and predictable amount of time.     Equipment Recommendations   BSC/3in1;Other (comment);Wheelchair (measurements OT);Wheelchair cushion (measurements OT) (RW)      Precautions/Restrictions   Precautions Precautions: Fall Recall of Precautions/Restrictions: Intact Restrictions Weight Bearing Restrictions Per Provider Order: Yes LLE Weight Bearing Per Provider Order: Weight bearing as tolerated     Mobility Bed Mobility Overal bed mobility: Needs Assistance Bed Mobility: Supine to Sit, Sit to Supine     Supine to sit: Max assist Sit to supine: Mod assist         Transfers                   General transfer comment: uses BUEs to help move his LLE      Balance Overall balance assessment: Needs assistance Sitting-balance support: Feet supported, Bilateral upper extremity supported, Single extremity supported Sitting balance-Leahy Scale: Poor   Postural control: Posterior lean, Right lateral lean Standing balance support: Bilateral upper extremity supported, During functional activity, Reliant on assistive device for balance Standing balance-Leahy Scale: Poor                             ADL either performed or assessed with clinical judgement   ADL Overall ADL's : Needs assistance/impaired Eating/Feeding: Independent   Grooming: Set up;Sitting   Upper Body Bathing: Set up;Sitting   Lower Body Bathing: Moderate assistance;Sitting/lateral leans   Upper Body Dressing : Set up;Sitting   Lower Body Dressing: Maximal assistance;Sitting/lateral leans   Toilet Transfer: Moderate assistance;Stand-pivot   Toileting- Clothing Manipulation and Hygiene: Contact guard assist;Sitting/lateral lean       Functional mobility during ADLs: Moderate assistance General ADL Comments: limited by LLE pain and activity tolerance, he is unable to reach his L foot     Vision Baseline Vision/History: 0 No visual deficits Vision Assessment?: No apparent visual deficits     Perception Perception: Within Functional Limits       Praxis Praxis: WFL       Pertinent Vitals/Pain Pain Assessment Pain Assessment: Faces Faces Pain Scale: Hurts even more Pain Location: left hip Pain Descriptors / Indicators: Aching Pain Intervention(s):  Limited activity within patient's tolerance, Monitored during session     Extremity/Trunk Assessment Upper Extremity Assessment Upper Extremity Assessment: Overall WFL for tasks assessed   Lower Extremity Assessment Lower Extremity Assessment: Defer to PT evaluation   Cervical / Trunk  Assessment Cervical / Trunk Assessment: Kyphotic   Communication Communication Communication: Impaired Factors Affecting Communication: Non - English speaking, interpreter not available (EASL)   Cognition Arousal: Alert Behavior During Therapy: WFL for tasks assessed/performed Cognition: No apparent impairments                               Following commands: Impaired Following commands impaired: Follows one step commands with increased time, Follows one step commands inconsistently     Cueing  General Comments   Cueing Techniques: Verbal cues;Tactile cues  VSS           Home Living Family/patient expects to be discharged to:: Private residence Living Arrangements: Spouse/significant other Available Help at Discharge: Family;Available PRN/intermittently Type of Home: Mobile home Home Access: Stairs to enter Entrance Stairs-Number of Steps: 3+3 Entrance Stairs-Rails: None Home Layout: One level     Bathroom Shower/Tub: Chief Strategy Officer: Standard     Home Equipment: None   Additional Comments: Mechanic      Prior Functioning/Environment Prior Level of Function : Independent/Modified Independent;Working/employed;Driving             Mobility Comments: no AD ADLs Comments: works, indep    OT Problem List: Decreased strength;Decreased range of motion;Decreased activity tolerance;Pain   OT Treatment/Interventions: Self-care/ADL training;Therapeutic exercise;DME and/or AE instruction;Therapeutic activities;Balance training;Patient/family education      OT Goals(Current goals can be found in the care plan section)   Acute Rehab OT Goals Patient Stated Goal: home OT Goal Formulation: With patient Time For Goal Achievement: 10/05/24 Potential to Achieve Goals: Good ADL Goals Pt Will Perform Lower Body Dressing: with min assist;with adaptive equipment;sit to/from stand Pt Will Transfer to Toilet: with min  assist;ambulating;bedside commode Pt Will Perform Toileting - Clothing Manipulation and hygiene: with modified independence Additional ADL Goal #1: Pt will complete bed mobility with min A as a precursor to ADLs   OT Frequency:  Min 2X/week    Co-evaluation PT/OT/SLP Co-Evaluation/Treatment: Yes Reason for Co-Treatment: Complexity of the patient's impairments (multi-system involvement);For patient/therapist safety;To address functional/ADL transfers   OT goals addressed during session: ADL's and self-care      AM-PAC OT 6 Clicks Daily Activity     Outcome Measure Help from another person eating meals?: None Help from another person taking care of personal grooming?: A Little Help from another person toileting, which includes using toliet, bedpan, or urinal?: A Lot Help from another person bathing (including washing, rinsing, drying)?: A Lot Help from another person to put on and taking off regular upper body clothing?: A Little Help from another person to put on and taking off regular lower body clothing?: A Lot 6 Click Score: 16   End of Session Nurse Communication: Mobility status  Activity Tolerance: Patient tolerated treatment well Patient left: in bed;with call bell/phone within reach;with bed alarm set  OT Visit Diagnosis: Unsteadiness on feet (R26.81);Other abnormalities of gait and mobility (R26.89);Pain                Time: 1425-1441 OT Time Calculation (min): 16 min Charges:  OT General Charges $OT Visit: 1 Visit OT Evaluation $OT Eval Moderate Complexity: 1 Mod  Jose Summers, OTR/L Acute  Rehabilitation Services Office [phone] Secure Chat Communication Preferred   Jose Summers 09/21/2024, 3:53 PM

## 2024-09-21 NOTE — Plan of Care (Signed)
  Problem: Coping: Goal: Ability to adjust to condition or change in health will improve Outcome: Progressing   Problem: Activity: Goal: Risk for activity intolerance will decrease Outcome: Progressing   Problem: Pain Managment: Goal: General experience of comfort will improve and/or be controlled Outcome: Progressing

## 2024-09-21 NOTE — Evaluation (Signed)
 Physical Therapy Evaluation Patient Details Name: Jose Summers MRN: [id number] DOB: 06/02/1961 Today's Date: 09/21/2024  History of Present Illness  SUSANA GRIPP is a 63 y.o. male admitted 12/19 after injuring left hip resulting in left hip fx.  ORIF 12/20.  PMH: obese type II, hyperlipidemia, hypertension, chronic hyponatremia, psoriasis  Clinical Impression  Pt admitted with above diagnosis. Pt with difficulty with transfer to chair needing mod assist and max cues for posture and safety.  Used RW for support with heavy lean on RW.  Pt significantly limited by pain. Pt reports that he has a wife and daughter and one of them will always be at home. Pt states he will have 24 hour care.  If he does, HHPT appropriate but if he doesn't post acute rehab < 3 hours day is appropriate. Will follow acutely.  Pt currently with functional limitations due to the deficits listed below (see PT Problem List). Pt will benefit from acute skilled PT to increase their independence and safety with mobility to allow discharge.           If plan is discharge home, recommend the following: A lot of help with walking and/or transfers;A lot of help with bathing/dressing/bathroom;Assistance with cooking/housework;Assist for transportation;Help with stairs or ramp for entrance   Can travel by private vehicle   No    Equipment Recommendations Rolling walker (2 wheels);BSC/3in1;Wheelchair (measurements PT);Wheelchair cushion (measurements PT)  Recommendations for Other Services       Functional Status Assessment Patient has had a recent decline in their functional status and demonstrates the ability to make significant improvements in function in a reasonable and predictable amount of time.     Precautions / Restrictions Precautions Precautions: Fall Recall of Precautions/Restrictions: Intact Restrictions Weight Bearing Restrictions Per Provider Order: Yes LLE Weight Bearing Per Provider Order: Weight bearing  as tolerated      Mobility  Bed Mobility Overal bed mobility: Needs Assistance Bed Mobility: Supine to Sit     Supine to sit: Used rails, HOB elevated, Max assist     General bed mobility comments: Pt needed max assist to transition to EOB.  Incr time and assist to scoot to EOB as well.    Transfers Overall transfer level: Needs assistance Equipment used: Rolling walker (2 wheels) Transfers: Sit to/from Stand, Bed to chair/wheelchair/BSC Sit to Stand: Mod assist, From elevated surface Stand pivot transfers: Mod assist, From elevated surface         General transfer comment: Pt needed mod assist to power up with bed significantly elevated. Pt then took incr time to stand and pivot with use of RW to the recliner with heavy mod assist entire transfer. Needed cues to stand tall with pt having a difficult time weight shifting as he didn want to put full weight on the left leg due to pain. Pt needs constant cues for technique and heavy mod assist.    Ambulation/Gait               General Gait Details: unable to progress today  Stairs            Wheelchair Mobility     Tilt Bed    Modified Rankin (Stroke Patients Only)       Balance Overall balance assessment: Needs assistance Sitting-balance support: Feet supported, Bilateral upper extremity supported, Single extremity supported Sitting balance-Leahy Scale: Poor   Postural control: Posterior lean, Right lateral lean Standing balance support: Bilateral upper extremity supported, During functional activity, Reliant on assistive  device for balance Standing balance-Leahy Scale: Poor Standing balance comment: relies on external support and RW, leans heavily to right and cant stand upright.                             Pertinent Vitals/Pain Pain Assessment Pain Assessment: Faces Faces Pain Scale: Hurts even more Pain Location: left hip Pain Descriptors / Indicators: Aching Pain Intervention(s):  Limited activity within patient's tolerance, Monitored during session, Repositioned    Home Living Family/patient expects to be discharged to:: Private residence Living Arrangements: Spouse/significant other Available Help at Discharge: Family;Available PRN/intermittently (wife works, son and daughter work as well) Type of Home: Mobile home Home Access: Stairs to enter Entrance Stairs-Rails: None Entrance Stairs-Number of Steps: 3+3   Home Layout: One level Home Equipment: None Additional Comments: Mechanic    Prior Function Prior Level of Function : Independent/Modified Independent;Working/employed;Driving                     Extremity/Trunk Assessment   Upper Extremity Assessment Upper Extremity Assessment: Defer to OT evaluation    Lower Extremity Assessment Lower Extremity Assessment: LLE deficits/detail LLE: Unable to fully assess due to pain    Cervical / Trunk Assessment Cervical / Trunk Assessment: Kyphotic  Communication   Communication Communication: No apparent difficulties    Cognition Arousal: Alert Behavior During Therapy: WFL for tasks assessed/performed   PT - Cognitive impairments: Problem solving, Safety/Judgement, Sequencing                         Following commands: Impaired Following commands impaired: Follows one step commands with increased time, Follows one step commands inconsistently     Cueing Cueing Techniques: Verbal cues, Tactile cues     General Comments General comments (skin integrity, edema, etc.): VSS    Exercises General Exercises - Lower Extremity Ankle Circles/Pumps: AROM, Both, 10 reps, Supine Quad Sets: AROM, Both, 10 reps, Supine Gluteal Sets: AROM, Both, 10 reps, Supine Long Arc Quad: AROM, Both, 10 reps, Seated   Assessment/Plan    PT Assessment Patient needs continued PT services  PT Problem List Decreased activity tolerance;Decreased balance;Decreased mobility;Decreased strength;Decreased  range of motion;Decreased knowledge of use of DME;Decreased safety awareness;Decreased knowledge of precautions;Pain       PT Treatment Interventions DME instruction;Gait training;Functional mobility training;Therapeutic activities;Therapeutic exercise;Balance training;Patient/family education;Wheelchair mobility training    PT Goals (Current goals can be found in the Care Plan section)  Acute Rehab PT Goals Patient Stated Goal: to go hhome PT Goal Formulation: With patient Time For Goal Achievement: 10/05/24 Potential to Achieve Goals: Good    Frequency Min 2X/week     Co-evaluation               AM-PAC PT 6 Clicks Mobility  Outcome Measure Help needed turning from your back to your side while in a flat bed without using bedrails?: A Lot Help needed moving from lying on your back to sitting on the side of a flat bed without using bedrails?: A Lot Help needed moving to and from a bed to a chair (including a wheelchair)?: A Lot Help needed standing up from a chair using your arms (e.g., wheelchair or bedside chair)?: A Lot Help needed to walk in hospital room?: Total Help needed climbing 3-5 steps with a railing? : Total 6 Click Score: 10    End of Session Equipment Utilized During Treatment: Gait belt Activity  Tolerance: Patient limited by fatigue;Patient limited by pain Patient left: in chair;with call bell/phone within reach;with chair alarm set Nurse Communication: Mobility status;Need for lift equipment Laurent) PT Visit Diagnosis: Muscle weakness (generalized) (M62.81)    Time: 9060-8982 PT Time Calculation (min) (ACUTE ONLY): 38 min   Charges:   PT Evaluation $PT Eval Moderate Complexity: 1 Mod PT Treatments $Therapeutic Exercise: 8-22 mins $Therapeutic Activity: 8-22 mins PT General Charges $$ ACUTE PT VISIT: 1 Visit         Jacinta Penalver M,PT Acute Rehab Services [phone]   Stephane JULIANNA Bevel 09/21/2024, 10:57 AM

## 2024-09-21 NOTE — Progress Notes (Signed)
 Transition of Care [facility]) - CAGE-AID Screening   Patient Details  Name: Jose Summers MRN: [id number] Date of Birth: [birth year]  Transition of [facility]) CM/SW Contact:    Sallyanne MALVA Mettle, RN Phone Number: 09/21/2024, 10:19 PM     CAGE-AID Screening:    Have You Ever Felt You Ought to Cut Down on Your Drinking or Drug Use?: No Have People Annoyed You By Critizing Your Drinking Or Drug Use?: No Have You Felt Bad Or Guilty About Your Drinking Or Drug Use?: No Have You Ever Had a Drink or Used Drugs First Thing In The Morning to Steady Your Nerves or to Get Rid of a Hangover?: No CAGE-AID Score: 0  Substance Abuse Education Offered: No (Declines alcohol/drug use, no resources indicated)

## 2024-09-21 NOTE — Progress Notes (Addendum)
 " PROGRESS NOTE    Jose Summers  FMW:[id number] DOB: August 23, 1961 DOA: 09/18/2024 PCP: Duanne Butler DASEN, MD    Brief Narrative:  Jose Summers is a 63 y.o. male with medical history significant of obese type II, hyperlipidemia, hypertension, chronic hyponatremia, psoriasis    According to the patient's  daughter, her father went to the grocery store and he was attempted to get back into his truck. The patient however twisted into an awkward position and as result fell backwards onto his left hip. He immediately started to experience left hip pain.  Found to have a fracture of the left hip. He underwent ORIF on 09/19/2024. He has tolerated the procedure well.   PT/OT are ordered He is WBAT on the left lower extremity. Will restart eliquis . He will need to follow up with orthopedic surgery 2 weeks post-op.  Awaiting PT/OT recommendations.  Assessment and Plan: Closed left fracture, initial encounter --The patient is s/p intramedullary rod placement for closed fracture of the left hip. -- WBAT --PT/OT --anticipate rehab placement.     Hyponatremia -Na 128 on the morning oif 09/21/2024 increased from 125 on 09/20/2024.130 on admission. Likely due to pain and volume depletion. The patient is receiving IV NS at 125 cc/hr. Continue to monitor. --Urine sodium of less than 30 and urine osmolality of 413 confirms hypovolemia as the cause of hyponatremia.  and osmolality have been ordered --Recheck sodium at 1800.   Type 2 DM  -Sliding scale insulin    Hypertension - Was started on Norvasc  and lisinopril  - Monitor closely  Dehydration   HLD - continue statin therapy    DVT prophylaxis: Place and maintain sequential compression device Start: 09/18/24 2217    Code Status: Full Code Family Communication:   Disposition Plan:  Level of care: Med-Surg Status is: Inpatient     Consultants:  ortho   Subjective: The patient is resting comfortably. No new complaints.    Objective: Vitals:   09/20/24 0736 09/20/24 1504 09/21/24 0000 09/21/24 0914  BP: 107/67 96/62 113/73 128/72  Pulse: 74 86 88 91  Resp: 16 16 17 18   Temp: 98.4 F (36.9 C) 98.4 F (36.9 C) 98.8 F (37.1 C) 99.3 F (37.4 C)  TempSrc:   Oral   SpO2: 92% 94% 98% 94%  Weight:      Height:        Intake/Output Summary (Last 24 hours) at 09/21/2024 1253 Last data filed at 09/21/2024 9371 Gross per 24 hour  Intake 360 ml  Output 1075 ml  Net -715 ml   Filed Weights   09/18/24 1753 09/19/24 0000  Weight: 108.9 kg 100.5 kg    Examination:   Exam:  Constitutional:  The patient is awake, alert, and oriented x 3. No acute distress. Respiratory:  No increased work of breathing. No wheezes, rales, or rhonchi No tactile fremitus Cardiovascular:  Regular rate and rhythm No murmurs, ectopy, or gallups. No lateral PMI. No thrills. Abdomen:  Abdomen is soft, non-tender, non-distended No hernias, masses, or organomegaly Normoactive bowel sounds.  Musculoskeletal:  No cyanosis, clubbing, or edema Skin:  No rashes, lesions, ulcers palpation of skin: no induration or nodules Neurologic:  CN 2-12 intact Sensation all 4 extremities intact Psychiatric:  Mental status Mood, affect appropriate Orientation to person, place, time  judgment and insight appear intact      Data Reviewed: I have personally reviewed following labs and imaging studies  CBC: Recent Labs  Lab 09/18/24 1835 09/20/24 0604 09/21/24 9662  WBC 5.5 9.4 9.2  NEUTROABS 3.7  --  7.1  HGB 11.4* 8.3* 7.1*  HCT 35.0* 25.2* 21.3*  MCV 80.1 80.0 80.4  PLT 269 205 190   Basic Metabolic Panel: Recent Labs  Lab 09/18/24 1835 09/20/24 0604 09/20/24 1909 09/21/24 0337  NA 130* 125* 126* 128*  K 3.8 4.7 4.4 4.5  CL 93* 92* 93* 94*  CO2 22 25 27 26   GLUCOSE 112* 171* 121* 144*  BUN 15 21 19 19   CREATININE 0.93 1.32* 1.10 1.08  CALCIUM  8.8* 8.2* 8.3* 8.2*   GFR: Estimated Creatinine  Clearance: 82.9 mL/min (by C-G formula based on SCr of 1.08 mg/dL). Liver Function Tests: No results for input(s): AST, ALT, ALKPHOS, BILITOT, PROT, ALBUMIN in the last 168 hours. No results for input(s): LIPASE, AMYLASE in the last 168 hours. No results for input(s): AMMONIA in the last 168 hours. Coagulation Profile: Recent Labs  Lab 09/18/24 1835  INR 1.0   Cardiac Enzymes: No results for input(s): CKTOTAL, CKMB, CKMBINDEX, TROPONINI in the last 168 hours. BNP (last 3 results) No results for input(s): PROBNP in the last 8760 hours. HbA1C: No results for input(s): HGBA1C in the last 72 hours. CBG: Recent Labs  Lab 09/20/24 0517 09/20/24 1148 09/20/24 1543 09/20/24 2049 09/21/24 0622  GLUCAP 189* 174* 169* 167* 128*   Lipid Profile: No results for input(s): CHOL, HDL, LDLCALC, TRIG, CHOLHDL, LDLDIRECT in the last 72 hours. Thyroid  Function Tests: No results for input(s): TSH, T4TOTAL, FREET4, T3FREE, THYROIDAB in the last 72 hours. Anemia Panel: No results for input(s): VITAMINB12, FOLATE, FERRITIN, TIBC, IRON, RETICCTPCT in the last 72 hours. Sepsis Labs: No results for input(s): PROCALCITON, LATICACIDVEN in the last 168 hours.  Recent Results (from the past 240 hours)  Surgical pcr screen     Status: None   Collection Time: 09/19/24 12:38 AM   Specimen: Nasal Mucosa; Nasal Swab  Result Value Ref Range Status   MRSA, PCR NEGATIVE NEGATIVE Final   Staphylococcus aureus NEGATIVE NEGATIVE Final    Comment: (NOTE) The Xpert SA Assay (FDA approved for NASAL specimens in patients 11 years of age and older), is one component of a comprehensive surveillance program. It is not intended to diagnose infection nor to guide or monitor treatment. Performed at [facility], [address] [place], [place] [address]          Radiology Studies: DG HIP PORT UNILAT WITH PELVIS 1V LEFT Result Date:  09/19/2024 EXAM: 1 VIEW(S) XRAY OF THE PELVIS AND LEFT HIP 09/19/2024 10:55:00 PM COMPARISON: None available. CLINICAL HISTORY: Deficient knowledge of open reduction and internal (ORIF) fixation of hip. FINDINGS: BONES AND JOINTS: SI joints are symmetric. Intramedullary rod and screw fixation across an intertrochanteric fracture of the left femur. The right hip demonstrates normal alignment. SOFT TISSUES: The soft tissues are unremarkable. IMPRESSION: 1. Intramedullary rod and screw fixation across an intertrochanteric fracture of the left femur. Electronically signed by: Norman Gatlin MD 09/19/2024 10:59 PM EST RP Workstation: [id number]   DG HIP UNILAT WITH PELVIS 2-3 VIEWS LEFT Result Date: 09/19/2024 CLINICAL DATA:  FT surgery.  Left femoral ORIF. EXAM: DG HIP (WITH OR WITHOUT PELVIS) 2-3V*L* COMPARISON:  Left hip radiograph dated 09/19/2024. FINDINGS: Intraoperative fluoroscopic spot images of the left hip provided. The total fluoroscopic time is 75.1 seconds with cumulative air kerma of 33.34 mGy. Status post left femoral ORIF. IMPRESSION: Intraoperative fluoroscopic spot images of the left hip. Electronically Signed   By: Vanetta Shelia HERO.D.  On: 09/19/2024 14:09   DG C-Arm 1-60 Min-No Report Result Date: 09/19/2024 Fluoroscopy was utilized by the requesting physician.  No radiographic interpretation.   Scheduled Meds:  amLODipine   5 mg Oral Daily   docusate sodium   100 mg Oral BID   insulin  aspart  0-6 Units Subcutaneous TID WC   lisinopril   40 mg Oral Daily   loratadine   10 mg Oral Daily   senna  1 tablet Oral BID   Continuous Infusions:NS at 125  sodium chloride  125 mL/hr at 09/20/24 1424      LOS: 3 days    Time spent: 42 minutes spent on chart review, discussion with nursing staff, consultants, updating family and interview/physical exam; more than 50% of that time was spent in counseling and/or coordination of care.    Jaryd Drew, DO [facility] Available via  Epic secure chat 7am-7pm After these hours, please refer to coverage provider listed on [email or web address] 09/21/2024, 12:53 PM   "

## 2024-09-22 LAB — CBC WITH DIFFERENTIAL/PLATELET
Abs Immature Granulocytes: 0.04 K/uL (ref 0.00–0.07)
Basophils Absolute: 0 K/uL (ref 0.0–0.1)
Basophils Relative: 0 %
Eosinophils Absolute: 0.2 K/uL (ref 0.0–0.5)
Eosinophils Relative: 2 %
HCT: 20.3 % — ABNORMAL LOW (ref 39.0–52.0)
Hemoglobin: 6.7 g/dL — CL (ref 13.0–17.0)
Immature Granulocytes: 1 %
Lymphocytes Relative: 19 %
Lymphs Abs: 1.5 K/uL (ref 0.7–4.0)
MCH: 26.9 pg (ref 26.0–34.0)
MCHC: 33 g/dL (ref 30.0–36.0)
MCV: 81.5 fL (ref 80.0–100.0)
Monocytes Absolute: 1 K/uL (ref 0.1–1.0)
Monocytes Relative: 12 %
Neutro Abs: 5.2 K/uL (ref 1.7–7.7)
Neutrophils Relative %: 66 %
Platelets: 210 K/uL (ref 150–400)
RBC: 2.49 MIL/uL — ABNORMAL LOW (ref 4.22–5.81)
RDW: 16.4 % — ABNORMAL HIGH (ref 11.5–15.5)
WBC: 7.9 K/uL (ref 4.0–10.5)
nRBC: 0 % (ref 0.0–0.2)

## 2024-09-22 LAB — PREPARE RBC (CROSSMATCH)

## 2024-09-22 LAB — GLUCOSE, CAPILLARY
Glucose-Capillary: 123 mg/dL — ABNORMAL HIGH (ref 70–99)
Glucose-Capillary: 150 mg/dL — ABNORMAL HIGH (ref 70–99)
Glucose-Capillary: 154 mg/dL — ABNORMAL HIGH (ref 70–99)
Glucose-Capillary: 166 mg/dL — ABNORMAL HIGH (ref 70–99)

## 2024-09-22 LAB — HEMOGLOBIN AND HEMATOCRIT, BLOOD
HCT: 23 % — ABNORMAL LOW (ref 39.0–52.0)
Hemoglobin: 7.7 g/dL — ABNORMAL LOW (ref 13.0–17.0)

## 2024-09-22 LAB — ABO/RH: ABO/RH(D): A POS

## 2024-09-22 MED ORDER — SODIUM CHLORIDE 0.9% IV SOLUTION: Freq: Once | INTRAVENOUS | Status: AC

## 2024-09-22 NOTE — Progress Notes (Signed)
 PT Cancellation Note  Patient Details Name: Jose Summers MRN: [id number] DOB: Dec 28, 1960   Cancelled Treatment:    Reason Eval/Treat Not Completed: (P) Medical issues which prohibited therapy (pt currently pending blood transfusion, limited yesterday due to fatigue/weakness and pain so defer PT session today until after transfusion complete.) Will continue efforts per PT plan of care as schedule permits.   Damarius Karnes M Jaeceon Michelin 09/22/2024, 11:01 AM

## 2024-09-22 NOTE — Progress Notes (Signed)
 " PROGRESS NOTE    DEMPSY Summers  FMW:[id number] DOB: [birth year] DOA: 09/18/2024 PCP: Duanne Butler DASEN, MD  Subjective: Patient reports feeling okay, has pain at his left hip but tolerable.   Hospital Course: 63 y.o. male with medical history significant of obese type II, hyperlipidemia, hypertension, chronic hyponatremia, psoriasis who presented after a mechanical fall and found to have a fracture of the left hip. Now s/p ORIF on 09/19/24. Post op, he developed acute anemia with Hb down to 6.7 (on arrival Hb was 11.4) and received 1U PRBC.    Assessment and Plan:  Closed left hip fracture, initial encounter --The patient is s/p intramedullary rod placement for closed fracture of the left hip. -- WBAT -- PT/OT -- anticipate rehab placement  Acute anemia  - Likely post op, he does have ecchymosis on the left thigh and left groin area - s/p 1U PRBC with Hb 7.7 post transfusion  - monitor CBC and transfuse for Hb<7 - if continues to drop and ecchymosis on the thigh expands, will get CT imaging of the leg and request ortho follow up    Hyponatremia -Na 128 on the morning oif 09/21/2024 increased from 125 on 09/20/2024.130 on admission. Likely due to pain and volume depletion. The patient is receiving IV NS at 125 cc/hr. Continue to monitor. - check BMP in the am, most recent was 129    Type 2 DM  -Sliding scale insulin    Hypertension - Was started on Norvasc  and lisinopril , but BP has been on the softer side.  - will hold lisinopril  for now    HLD - continue statin therapy     DVT prophylaxis: Place and maintain sequential compression device Start: 09/18/24 2217    Code Status: Full Code Family Communication: Updated at bedside Disposition Plan: TBD Reason for continuing need for hospitalization: PT/OT eval f/u  Objective: Vitals:   09/22/24 0754 09/22/24 0809 09/22/24 1133 09/22/24 1355  BP: 108/73 132/85 122/75 103/63  Pulse: 75 98 81 82  Resp: 17 17 18 16   Temp:  98.3 F (36.8 C) 98.2 F (36.8 C) 98.1 F (36.7 C) 98.3 F (36.8 C)  TempSrc: Oral Oral Oral   SpO2: 94% 94% 95% 92%  Weight:      Height:        Intake/Output Summary (Last 24 hours) at 09/22/2024 1504 Last data filed at 09/22/2024 1133 Gross per 24 hour  Intake 1658 ml  Output 1625 ml  Net 33 ml   Filed Weights   09/18/24 1753 09/19/24 0000  Weight: 108.9 kg 100.5 kg    Examination:  Physical Exam Vitals and nursing note reviewed.  Constitutional:      General: He is not in acute distress. Cardiovascular:     Rate and Rhythm: Normal rate.  Pulmonary:     Effort: No respiratory distress.     Breath sounds: No wheezing.  Abdominal:     General: There is no distension.     Tenderness: There is no abdominal tenderness.  Skin:    Comments: Ecchymosis around the surgical site on the left hip, extending to the left groin area. Tense to touch but no palpable hematoma      Data Reviewed: I have personally reviewed following labs and imaging studies  CBC: Recent Labs  Lab 09/18/24 1835 09/20/24 0604 09/21/24 0337 09/22/24 0436 09/22/24 1339  WBC 5.5 9.4 9.2 7.9  --   NEUTROABS 3.7  --  7.1 5.2  --   HGB 11.4*  8.3* 7.1* 6.7* 7.7*  HCT 35.0* 25.2* 21.3* 20.3* 23.0*  MCV 80.1 80.0 80.4 81.5  --   PLT 269 205 190 210  --    Basic Metabolic Panel: Recent Labs  Lab 09/18/24 1835 09/20/24 0604 09/20/24 1909 09/21/24 0337 09/21/24 1915  NA 130* 125* 126* 128* 129*  K 3.8 4.7 4.4 4.5 4.4  CL 93* 92* 93* 94* 93*  CO2 22 25 27 26 28   GLUCOSE 112* 171* 121* 144* 136*  BUN 15 21 19 19 17   CREATININE 0.93 1.32* 1.10 1.08 0.82  CALCIUM  8.8* 8.2* 8.3* 8.2* 8.8*   GFR: Estimated Creatinine Clearance: 109.1 mL/min (by C-G formula based on SCr of 0.82 mg/dL). Liver Function Tests: No results for input(s): AST, ALT, ALKPHOS, BILITOT, PROT, ALBUMIN in the last 168 hours. No results for input(s): LIPASE, AMYLASE in the last 168 hours. No results for  input(s): AMMONIA in the last 168 hours. Coagulation Profile: Recent Labs  Lab 09/18/24 1835  INR 1.0   Cardiac Enzymes: No results for input(s): CKTOTAL, CKMB, CKMBINDEX, TROPONINI in the last 168 hours. ProBNP, BNP (last 5 results) No results for input(s): PROBNP, BNP in the last 8760 hours. HbA1C: No results for input(s): HGBA1C in the last 72 hours. CBG: Recent Labs  Lab 09/21/24 1303 09/21/24 1639 09/21/24 2120 09/22/24 0618 09/22/24 1148  GLUCAP 167* 150* 191* 123* 150*   Lipid Profile: No results for input(s): CHOL, HDL, LDLCALC, TRIG, CHOLHDL, LDLDIRECT in the last 72 hours. Thyroid  Function Tests: No results for input(s): TSH, T4TOTAL, FREET4, T3FREE, THYROIDAB in the last 72 hours. Anemia Panel: No results for input(s): VITAMINB12, FOLATE, FERRITIN, TIBC, IRON, RETICCTPCT in the last 72 hours. Sepsis Labs: No results for input(s): PROCALCITON, LATICACIDVEN in the last 168 hours.  Recent Results (from the past 240 hours)  Surgical pcr screen     Status: None   Collection Time: 09/19/24 12:38 AM   Specimen: Nasal Mucosa; Nasal Swab  Result Value Ref Range Status   MRSA, PCR NEGATIVE NEGATIVE Final   Staphylococcus aureus NEGATIVE NEGATIVE Final    Comment: (NOTE) The Xpert SA Assay (FDA approved for NASAL specimens in patients 61 years of age and older), is one component of a comprehensive surveillance program. It is not intended to diagnose infection nor to guide or monitor treatment. Performed at [facility], [address], [place], [place] [address]      Radiology Studies: No results found.  Scheduled Meds:  amLODipine   5 mg Oral Daily   docusate sodium   100 mg Oral BID   insulin  aspart  0-6 Units Subcutaneous TID WC   lisinopril   40 mg Oral Daily   loratadine   10 mg Oral Daily   senna  1 tablet Oral BID   Continuous Infusions:   LOS: 4 days   Time spent: 38 minutes  Jose Dare,  MD  [facility]  09/22/2024, 3:04 PM   "

## 2024-09-22 NOTE — Progress Notes (Addendum)
 Physical Therapy Treatment Patient Details Name: Jose Summers MRN: [id number] DOB: [birth year] Today's Date: 09/22/2024   History of Present Illness Jose Summers is a 63 y.o. male admitted 12/19 after injuring left hip resulting in left hip fx. ORIF 12/20.  PMH: obese type II, hyperlipidemia, hypertension, chronic hyponatremia, psoriasis    PT Comments  Pt received in supine, agreeable to therapy session, pt reporting moderate to severe pain with functional mobility tasks this date. Pt needing increased time, multimodal cues and heavy reliance on hospital bed features/rail to perform bed mobility and transfers. Pt needing up to maxA to sit up to left side of bed and up to modA to stand from elevated bed height and to take pivotal steps from bed to chair on his L. Pt quick to fatigue, with flexed trunk and would benefit from staff using Stedy platform lift for transfer back to bed from chair once he is ready. Pt assisted to set up dinner tray which arrived during session. BP noted to be orthostatic initially with transfer to sitting EOB, pt denies symptoms, BP more stable sitting in chair after step pivot. Patient will benefit from continued inpatient follow up therapy, <3 hours/day, he does not have consistent supervision or physical assist at home as his family members all work full time and currently pt needing heavy lift assist to mobilize safely OOB and not yet able to perform household ambulation.  Patient Position (if appropriate) Orthostatic Vitals  Orthostatic Lying   BP- Lying 137/74  Pulse- Lying 85  Orthostatic Sitting  BP- Sitting (!) 103/93 (EOB)  Pulse- Sitting 92   Orthostatic Sitting  BP- Sitting 124/79 (in recliner after step pivot)  Pulse- Sitting 91     If plan is discharge home, recommend the following: A lot of help with walking and/or transfers;A lot of help with bathing/dressing/bathroom;Assistance with cooking/housework;Assist for transportation;Help with stairs or  ramp for entrance;Two people to help with walking and/or transfers (+2 for gait, heavy +1 to stand)   Can travel by private vehicle     No  Equipment Recommendations  Rolling walker (2 wheels);BSC/3in1;Wheelchair (measurements PT);Wheelchair cushion (measurements PT)    Recommendations for Other Services       Precautions / Restrictions Precautions Precautions: Fall Recall of Precautions/Restrictions: Intact Precaution/Restrictions Comments: seems to do better with Spanish language communication when pain is higher Restrictions Weight Bearing Restrictions Per Provider Order: Yes LLE Weight Bearing Per Provider Order: Weight bearing as tolerated     Mobility  Bed Mobility Overal bed mobility: Needs Assistance Bed Mobility: Supine to Sit     Supine to sit: Max assist     General bed mobility comments: Increased time/effort, multimodal cues for using RLE to assist with bridging hips and scooting in addition to L bed rail when sitting up to L EOB. Pt denies dizziness once EOB although SBP dropped >30 points.    Transfers Overall transfer level: Needs assistance Equipment used: Rolling walker (2 wheels) Transfers: Sit to/from Stand, Bed to chair/wheelchair/BSC Sit to Stand: Mod assist, From elevated surface   Step pivot transfers: Mod assist, From elevated surface       General transfer comment: Increased time/effort to transfer from seated posture to RW, mod cues for safe UE/LE placement and cues to keep LLE slightly advanced prior to sitting/standing for less pain.    Ambulation/Gait               General Gait Details: unable to progress today due to pain while performing  step pivot and drop in BP with supine to sit, though pt denies dizziness. BP recheck in chair WFL, but pt defers additional gait trials due to pain/fatigue.   Stairs             Wheelchair Mobility     Tilt Bed    Modified Rankin (Stroke Patients Only)       Balance Overall  balance assessment: Needs assistance Sitting-balance support: Feet supported, Single extremity supported, No upper extremity supported Sitting balance-Leahy Scale: Fair Sitting balance - Comments: preferring UE support due to L hip pain   Standing balance support: Bilateral upper extremity supported, During functional activity, Reliant on assistive device for balance Standing balance-Leahy Scale: Poor Standing balance comment: heavy reliance on BUE due to pain and gait belt support                            Communication Communication Communication: Impaired Factors Affecting Communication: Non - English speaking, interpreter not available (EASL; pt does not want interpreter, but PTA able to explain most concepts in Spanish for him PRN.)  Cognition Arousal: Alert Behavior During Therapy: WFL for tasks assessed/performed   PT - Cognitive impairments: Problem solving, Safety/Judgement, Sequencing, Initiation, Attention                       PT - Cognition Comments: Some slow processing, increased time due to pain. He seems to communicate better in Spanish when pain increased compared with English, PTA bilingual and able to explain concepts in spanish for him PRN. Pt denies wanting interpreter for session. Following commands: Impaired Following commands impaired: Follows one step commands with increased time, Only follows one step commands consistently    Cueing Cueing Techniques: Verbal cues, Tactile cues, Gestural cues  Exercises      General Comments General comments (skin integrity, edema, etc.): see BP in comments above; orthostatic hypotension but pt denies symptoms of weakness or dizziness, only reports pain.      Pertinent Vitals/Pain Pain Assessment Pain Assessment: 0-10 Pain Score: 7  Pain Location: left hip Pain Descriptors / Indicators: Aching, Grimacing, Guarding, Moaning Pain Intervention(s): Limited activity within patient's tolerance, Monitored  during session, Premedicated before session, Repositioned, Other (comment) (pt defers ice pack)    Home Living                          Prior Function            PT Goals (current goals can now be found in the care plan section) Acute Rehab PT Goals Patient Stated Goal: to go home once moving on my own, family members all work full time PT Goal Formulation: With patient Time For Goal Achievement: 10/05/24 Progress towards PT goals: Progressing toward goals (slowly)    Frequency    Min 2X/week      PT Plan      Co-evaluation              AM-PAC PT 6 Clicks Mobility   Outcome Measure  Help needed turning from your back to your side while in a flat bed without using bedrails?: A Lot Help needed moving from lying on your back to sitting on the side of a flat bed without using bedrails?: Total (w/o HOB elevated and rail pt unable to sit up with +1 assist) Help needed moving to and from a bed to a chair (including  a wheelchair)?: A Lot Help needed standing up from a chair using your arms (e.g., wheelchair or bedside chair)?: A Lot Help needed to walk in hospital room?: Total Help needed climbing 3-5 steps with a railing? : Total 6 Click Score: 9    End of Session Equipment Utilized During Treatment: Gait belt Activity Tolerance: Patient limited by fatigue;Patient limited by pain Patient left: in chair;with call bell/phone within reach;with chair alarm set Nurse Communication: Mobility status;Need for lift equipment;Precautions;Other (comment) (recommend +2 and Stedy, PTA left a gait belt in his room) PT Visit Diagnosis: Muscle weakness (generalized) (M62.81)     Time: 8297-8266 PT Time Calculation (min) (ACUTE ONLY): 31 min  Charges:    $Therapeutic Activity: 23-37 mins PT General Charges $$ ACUTE PT VISIT: 1 Visit                     Aliegha Paullin P., PTA Acute Rehabilitation Services Secure Chat Preferred 9a-5:30pm Office: [phone]    Connell HERO  [facility] 09/22/2024, 5:49 PM

## 2024-09-22 NOTE — Plan of Care (Signed)
  Problem: Health Behavior/Discharge Planning: °Goal: Ability to manage health-related needs will improve °Outcome: Progressing °  °Problem: Education: °Goal: Knowledge of General Education information will improve °Description: Including pain rating scale, medication(s)/side effects and non-pharmacologic comfort measures °Outcome: Progressing °  °Problem: Activity: °Goal: Risk for activity intolerance will decrease °Outcome: Progressing °  °

## 2024-09-22 NOTE — Progress Notes (Addendum)
" °   09/22/24 0531  Provider Notification  Provider Name/Title JINNY Kipper NP  Date Provider Notified 09/22/24  Time Provider Notified 0530  Method of Notification Page (secure chat)  Notification Reason Critical Result  Test performed and critical result CBC/HGB-6.7  Date Critical Result Received 09/22/24  Time Critical Result Received 0530   1 unit PRBC ordered, obtained consent to receive blood  from pt, released blood, spoke to [place] @ blood bank. She said it was still running and it would be at least 10 more mins, possibly longer. Gave her day shift RN name and  # as well "

## 2024-09-23 ENCOUNTER — Telehealth (HOSPITAL_COMMUNITY): Payer: Self-pay | Admitting: Pharmacy Technician

## 2024-09-23 ENCOUNTER — Other Ambulatory Visit (HOSPITAL_COMMUNITY): Payer: Self-pay

## 2024-09-23 DIAGNOSIS — S72142A Displaced intertrochanteric fracture of left femur, initial encounter for closed fracture: Secondary | ICD-10-CM

## 2024-09-23 LAB — TYPE AND SCREEN
ABO/RH(D): A POS
Antibody Screen: NEGATIVE
Unit division: 0

## 2024-09-23 LAB — CBC
HCT: 22.6 % — ABNORMAL LOW (ref 39.0–52.0)
Hemoglobin: 7.7 g/dL — ABNORMAL LOW (ref 13.0–17.0)
MCH: 27.3 pg (ref 26.0–34.0)
MCHC: 34.1 g/dL (ref 30.0–36.0)
MCV: 80.1 fL (ref 80.0–100.0)
Platelets: 249 K/uL (ref 150–400)
RBC: 2.82 MIL/uL — ABNORMAL LOW (ref 4.22–5.81)
RDW: 16 % — ABNORMAL HIGH (ref 11.5–15.5)
WBC: 6.4 K/uL (ref 4.0–10.5)
nRBC: 0 % (ref 0.0–0.2)

## 2024-09-23 LAB — BASIC METABOLIC PANEL WITH GFR
Anion gap: 8 (ref 5–15)
BUN: 16 mg/dL (ref 8–23)
CO2: 27 mmol/L (ref 22–32)
Calcium: 9.1 mg/dL (ref 8.9–10.3)
Chloride: 92 mmol/L — ABNORMAL LOW (ref 98–111)
Creatinine, Ser: 0.84 mg/dL (ref 0.61–1.24)
GFR, Estimated: >60 mL/min
Glucose, Bld: 129 mg/dL — ABNORMAL HIGH (ref 70–99)
Potassium: 4.6 mmol/L (ref 3.5–5.1)
Sodium: 127 mmol/L — ABNORMAL LOW (ref 135–145)

## 2024-09-23 LAB — BPAM RBC
Blood Product Expiration Date: 202601152359
ISSUE DATE / TIME: 202512230746
Unit Type and Rh: 6200

## 2024-09-23 LAB — GLUCOSE, CAPILLARY
Glucose-Capillary: 134 mg/dL — ABNORMAL HIGH (ref 70–99)
Glucose-Capillary: 175 mg/dL — ABNORMAL HIGH (ref 70–99)
Glucose-Capillary: 132 mg/dL — ABNORMAL HIGH (ref 70–99)
Glucose-Capillary: 185 mg/dL — ABNORMAL HIGH (ref 70–99)

## 2024-09-23 MED ORDER — APIXABAN 2.5 MG PO TABS
2.5000 mg | ORAL_TABLET | Freq: Two times a day (BID) | ORAL | Status: DC
  Administered 2024-09-23 – 2024-09-28 (×11): 2.5 mg via ORAL
  Filled 2024-09-23 (×11): qty 1

## 2024-09-23 NOTE — Progress Notes (Signed)
 Physical Therapy Treatment Patient Details Name: Jose Summers MRN: [id number] DOB: August 23, 1961 Today's Date: 09/23/2024   History of Present Illness Jose Summers is a 63 y.o. male admitted 12/19 after injuring left hip resulting in left hip fx. ORIF 12/20.  PMH: obese type II, hyperlipidemia, hypertension, chronic hyponatremia, psoriasis    PT Comments  Pt received in supine, pleasantly agreeable to therapy session and with c/o moderate pain in L hip while standing and performing pre-gait tasks at RW and pivoting to chair. Pt c/o fatigue and pain once in recliner and defers additional standing trials or gait trial this date. RN notified PTA recommends +2 and RW vs Stedy with +1 for safe return from chair to bed later in the day. Pt defers ice pack for pain, chair alarm on for safety in recliner. Patient will benefit from continued inpatient follow up therapy, <3 hours/day as pt reports his family members work full time during the week.     If plan is discharge home, recommend the following: A lot of help with bathing/dressing/bathroom;Assistance with cooking/housework;Assist for transportation;Help with stairs or ramp for entrance;Two people to help with walking and/or transfers (+2 for gait, heavy +1 to stand)   Can travel by private vehicle     No  Equipment Recommendations  Rolling walker (2 wheels);BSC/3in1;Wheelchair (measurements PT);Wheelchair cushion (measurements PT) (at current level of function would also need hospital bed, pending progress)    Recommendations for Other Services       Precautions / Restrictions Precautions Precautions: Fall Recall of Precautions/Restrictions: Intact Precaution/Restrictions Comments: can communicate in english and spanish, but when pain increased Spanish may be easier Restrictions Weight Bearing Restrictions Per Provider Order: Yes LLE Weight Bearing Per Provider Order: Weight bearing as tolerated     Mobility  Bed Mobility Overal bed  mobility: Needs Assistance Bed Mobility: Supine to Sit     Supine to sit: Min assist, Used rails, HOB elevated     General bed mobility comments: Increased time/effort, multimodal cues for using RLE to assist with bridging hips and scooting in addition to L bed rail when sitting up to L EOB. No c/o dizziness once seated, but needs time to rest prior to standing    Transfers Overall transfer level: Needs assistance Equipment used: Rolling walker (2 wheels) Transfers: Sit to/from Stand, Bed to chair/wheelchair/BSC Sit to Stand: Mod assist, From elevated surface   Step pivot transfers: Mod assist, From elevated surface       General transfer comment: Increased time/effort to transfer from seated posture to RW, mod cues for safe UE/LE placement and cues to keep LLE slightly advanced prior to sitting/standing for less pain. Pt unable to stand with minA from lower bed height, so bed elevated 3-4 and pt able to stand with modA from elevated bed surface. minA for stand>sit to cushioned recliner surface.    Ambulation/Gait             Pre-gait activities: standing BLE hip flexion x10 reps prior to step pivot ~57ft from bed to chair with RW. MinA for standing marches, min to modA for step pivot. General Gait Details: unable to progress today due to pain while performing step pivot, PTA encouraged him to stand again to work on gait after step pivot to chair but he defers, c/o pain/fatigue.   Stairs             Wheelchair Mobility     Tilt Bed    Modified Rankin (Stroke Patients Only)  Balance Overall balance assessment: Needs assistance Sitting-balance support: Feet supported, Single extremity supported, No upper extremity supported Sitting balance-Leahy Scale: Fair Sitting balance - Comments: preferring UE support due to L hip pain   Standing balance support: Bilateral upper extremity supported, During functional activity, Reliant on assistive device for  balance Standing balance-Leahy Scale: Poor Standing balance comment: heavy reliance on BUE due to pain and gait belt support                            Communication Communication Communication: Impaired Factors Affecting Communication: Non - English speaking, interpreter not available (EASL; pt does not want interpreter, but PTA able to explain most concepts in Spanish for him PRN.)  Cognition Arousal: Alert Behavior During Therapy: WFL for tasks assessed/performed   PT - Cognitive impairments: Problem solving, Safety/Judgement, Sequencing, Initiation, Attention                       PT - Cognition Comments: Some slow processing, increased time due to pain. He seems to communicate better in Spanish when pain increased compared with English, PTA bilingual and able to explain concepts in spanish for him PRN. Pt denies wanting interpreter for session. Following commands: Impaired Following commands impaired: Follows one step commands with increased time, Only follows one step commands consistently    Cueing Cueing Techniques: Verbal cues, Tactile cues, Gestural cues  Exercises General Exercises - Lower Extremity Ankle Circles/Pumps: AROM, Both, 5 reps, Supine Long Arc Quad: AROM, Both, 5 reps, Seated Heel Slides: AROM, Left, 10 reps, Supine Hip ABduction/ADduction: AROM, AAROM, Left, 10 reps, Supine Hip Flexion/Marching: AROM, Both, 10 reps, Standing (at RW)    General Comments General comments (skin integrity, edema, etc.): no acute s/sx distress other than c/o pain today      Pertinent Vitals/Pain Pain Assessment Pain Assessment: 0-10 Pain Score: 5  Pain Location: left hip while standing; 2-3/10 at rest Pain Descriptors / Indicators: Aching, Grimacing, Guarding, Sore Pain Intervention(s): Limited activity within patient's tolerance, Monitored during session, Repositioned (pt defers need for ice pack)    Home Living                           Prior Function            PT Goals (current goals can now be found in the care plan section) Acute Rehab PT Goals Patient Stated Goal: to go home once moving on my own, family members all work full time PT Goal Formulation: With patient Time For Goal Achievement: 10/05/24 Progress towards PT goals: Progressing toward goals    Frequency    Min 2X/week      PT Plan      Co-evaluation              AM-PAC PT 6 Clicks Mobility   Outcome Measure  Help needed turning from your back to your side while in a flat bed without using bedrails?: A Lot Help needed moving from lying on your back to sitting on the side of a flat bed without using bedrails?: Total (w/o HOB elevated and rail pt unable to sit up with +1 assist) Help needed moving to and from a bed to a chair (including a wheelchair)?: A Lot Help needed standing up from a chair using your arms (e.g., wheelchair or bedside chair)?: A Lot Help needed to walk in hospital room?: Total Help needed  climbing 3-5 steps with a railing? : Total 6 Click Score: 9    End of Session Equipment Utilized During Treatment: Gait belt Activity Tolerance: Patient limited by fatigue;Patient limited by pain Patient left: in chair;with call bell/phone within reach;with chair alarm set Nurse Communication: Mobility status;Need for lift equipment;Precautions;Other (comment) (recommend +2 if using RW and +1 if using Stedy, PTA left a gait belt in his room) PT Visit Diagnosis: Muscle weakness (generalized) (M62.81)     Time: 8642-8583 PT Time Calculation (min) (ACUTE ONLY): 19 min  Charges:    $Therapeutic Exercise: 8-22 mins PT General Charges $$ ACUTE PT VISIT: 1 Visit                     Glorianna Gott P., PTA Acute Rehabilitation Services Secure Chat Preferred 9a-5:30pm Office: [phone]    Connell HERO [facility] 09/23/2024, 2:38 PM

## 2024-09-23 NOTE — NC FL2 (Signed)
 " [place]  MEDICAID FL2 LEVEL OF CARE FORM     IDENTIFICATION  Patient Name: Jose Summers Birthdate: Dec 27, 1960 Sex: male Admission Date (Current Location): 09/18/2024  [facility] and [place] Number:  Producer, Television/film/video and Address:  [facility], [address] [place], [place] [address]      Provider Number: [id number]  Attending Physician Name and Address:  Cosette Blackwater, MD  Relative Name and Phone Number:  Alec, Jaros [phone]    Current Level of Care: Hospital Recommended Level of Care: Skilled Nursing Facility Prior Approval Number:    Date Approved/Denied:   PASRR Number: [id number]  Discharge Plan: SNF    Current Diagnoses: Patient Active Problem List   Diagnosis Date Noted   Displaced intertrochanteric fracture of left femur, initial encounter for closed fracture (HCC) 09/18/2024   Type 2 diabetes mellitus with hypoglycemia (HCC) 09/23/2023   Hypomagnesemia 09/23/2023   Hyponatremia 09/23/2023   Essential hypertension 09/23/2023   Mixed hyperlipidemia 09/23/2023   Obesity (BMI 30-39.9) 09/23/2023   Failure to thrive in adult 09/23/2023    Orientation RESPIRATION BLADDER Height & Weight     Self, Time, Situation, Place  Normal Continent Weight: 221 lb 9 oz (100.5 kg) Height:  5' 9 (175.3 cm)  BEHAVIORAL SYMPTOMS/MOOD NEUROLOGICAL BOWEL NUTRITION STATUS        Diet (see discharge summary)  AMBULATORY STATUS COMMUNICATION OF NEEDS Skin   Total Care Verbally Surgical wounds                       Personal Care Assistance Level of Assistance  Bathing, Feeding, Dressing Bathing Assistance: Limited assistance Feeding assistance: Independent Dressing Assistance: Maximum assistance     Functional Limitations Info  Sight, Hearing, Speech Sight Info: Adequate Hearing Info: Adequate Speech Info: Adequate    SPECIAL CARE FACTORS FREQUENCY  PT (By licensed PT), OT (By licensed OT)     PT Frequency: 5x  week OT Frequency: 5x week            Contractures Contractures Info: Not present    Additional Factors Info  Code Status, Allergies Code Status Info: full Allergies Info: NKA           Current Medications (09/23/2024):  This is the current hospital active medication list Current Facility-Administered Medications  Medication Dose Route Frequency Provider Last Rate Last Admin   acetaminophen  (TYLENOL ) tablet 650 mg  650 mg Oral Q6H PRN Stephens, Tiona K, MD       Or   acetaminophen  (TYLENOL ) suppository 650 mg  650 mg Rectal Q6H PRN Stephens, Tiona K, MD       amLODipine  (NORVASC ) tablet 5 mg  5 mg Oral Daily Stephens, Tiona K, MD   5 mg at 09/22/24 9196   benzonatate  (TESSALON ) capsule 100 mg  100 mg Oral TID PRN Stephens, Tiona K, MD   100 mg at 09/23/24 9681   docusate sodium  (COLACE) capsule 100 mg  100 mg Oral BID Stephens, Tiona K, MD   100 mg at 09/22/24 2047   HYDROcodone -acetaminophen  (NORCO/VICODIN) 5-325 MG per tablet 1-2 tablet  1-2 tablet Oral Q4H PRN Stephens, Tiona K, MD   2 tablet at 09/22/24 2047   insulin  aspart (novoLOG ) injection 0-6 Units  0-6 Units Subcutaneous TID WC Stephens, Tiona K, MD   1 Units at 09/22/24 1809   loratadine  (CLARITIN ) tablet 10 mg  10 mg Oral Daily Swayze, Ava, DO   10 mg at 09/22/24  9197   morphine  (PF) 2 MG/ML injection 2 mg  2 mg Intravenous Q2H PRN Stephens, Tiona K, MD   2 mg at 09/19/24 0041   ondansetron  (ZOFRAN ) tablet 4 mg  4 mg Oral Q6H PRN Stephens, Tiona K, MD       Or   ondansetron  (ZOFRAN ) injection 4 mg  4 mg Intravenous Q6H PRN Stephens, Tiona K, MD       senna [facility]) tablet 8.6 mg  1 tablet Oral BID Stephens, Tiona K, MD   8.6 mg at 09/22/24 2047   sodium chloride  (OCEAN) 0.65 % nasal spray 1 spray  1 spray Each Nare PRN Swayze, Ava, DO   1 spray at 09/20/24 2200     Discharge Medications: Please see discharge summary for a list of discharge medications.  Relevant Imaging Results:  Relevant Lab  Results:   Additional Information SSN: [id number]  Bridget Cordella Simmonds, LCSW     "

## 2024-09-23 NOTE — Progress Notes (Addendum)
 " PROGRESS NOTE    Jose Summers  FMW:[id number] DOB: April 15, 1961 DOA: 09/18/2024 PCP: Duanne Butler DASEN, MD  Subjective: No acute events overnight. Seen and examined at bedside. No new complaints. Pain controlled on current meds. Tolerating oral intake without n/v. Reports no BM but passing gas.    Hospital Course:  63 y.o. male with medical history significant of obese type II, hyperlipidemia, hypertension, chronic hyponatremia, psoriasis who presented after a mechanical fall and found to have a fracture of the left hip. Now s/p ORIF on 09/19/24. Post op, he developed acute anemia with Hb down to 6.7 (on arrival Hb was 11.4) and received 1U PRBC.    Assessment and Plan:  Closed left hip fracture, initial encounter --The patient is s/p intramedullary rod placement for closed fracture of the left hip. -- WBAT - will reach out to orthopedic surgery for post-op DVT prophylaxis recommendations -- PT/OT following -- anticipate rehab placement   Acute anemia  - Likely post op, he does have ecchymosis on the left thigh and left groin area - s/p 1U PRBC with Hb 7.7 post transfusion  - monitor CBC and transfuse for Hb<7 - if continues to drop and ecchymosis on the thigh expands, will get CT imaging of the leg and request ortho follow up    Hyponatremia - chronic, asymptomatic -Na 127 < 128. 130 on admission. Chart reviewed, patient hovers around high 120s to low 130s at baseline  - monitor clinically   Type 2 DM  -Sliding scale insulin    Hypertension - Was started on Norvasc  and lisinopril , but BP has been on the softer side.  - will hold lisinopril  for now    HLD - continue statin therapy  DVT prophylaxis: Place and maintain sequential compression device Start: 09/18/24 2217  SCDs   Code Status: Full Code Family communication: updated daughter at bedside Disposition Plan: SNF Reason for continuing need for hospitalization: medically ready, waiting SNF  placement  Objective: Vitals:   09/22/24 1710 09/22/24 2056 09/23/24 0316 09/23/24 0741  BP: 137/74 118/70 121/72 138/85  Pulse: 85 83 79 79  Resp:  18 18 18   Temp:  98.3 F (36.8 C) 98.8 F (37.1 C) 98 F (36.7 C)  TempSrc:    Oral  SpO2: 100% 100% 97% 97%  Weight:      Height:        Intake/Output Summary (Last 24 hours) at 09/23/2024 1334 Last data filed at 09/23/2024 1056 Gross per 24 hour  Intake 480 ml  Output 2200 ml  Net -1720 ml   Filed Weights   09/18/24 1753 09/19/24 0000  Weight: 108.9 kg 100.5 kg    Examination:  Physical Exam Vitals and nursing note reviewed.  Constitutional:      General: He is not in acute distress.    Appearance: He is ill-appearing.  HENT:     Head: Normocephalic and atraumatic.  Cardiovascular:     Rate and Rhythm: Normal rate and regular rhythm.     Pulses: Normal pulses.     Heart sounds: Normal heart sounds.  Pulmonary:     Effort: Pulmonary effort is normal.     Breath sounds: Normal breath sounds.  Abdominal:     General: Bowel sounds are normal.     Palpations: Abdomen is soft.  Neurological:     Mental Status: He is alert.     Data Reviewed: I have personally reviewed following labs and imaging studies  CBC: Recent Labs  Lab 09/18/24 1835  09/20/24 0604 09/21/24 0337 09/22/24 0436 09/22/24 1339 09/23/24 0503  WBC 5.5 9.4 9.2 7.9  --  6.4  NEUTROABS 3.7  --  7.1 5.2  --   --   HGB 11.4* 8.3* 7.1* 6.7* 7.7* 7.7*  HCT 35.0* 25.2* 21.3* 20.3* 23.0* 22.6*  MCV 80.1 80.0 80.4 81.5  --  80.1  PLT 269 205 190 210  --  249   Basic Metabolic Panel: Recent Labs  Lab 09/20/24 0604 09/20/24 1909 09/21/24 0337 09/21/24 1915 09/23/24 0503  NA 125* 126* 128* 129* 127*  K 4.7 4.4 4.5 4.4 4.6  CL 92* 93* 94* 93* 92*  CO2 25 27 26 28 27   GLUCOSE 171* 121* 144* 136* 129*  BUN 21 19 19 17 16   CREATININE 1.32* 1.10 1.08 0.82 0.84  CALCIUM  8.2* 8.3* 8.2* 8.8* 9.1   GFR: Estimated Creatinine Clearance: 106.5  mL/min (by C-G formula based on SCr of 0.84 mg/dL). Liver Function Tests: No results for input(s): AST, ALT, ALKPHOS, BILITOT, PROT, ALBUMIN in the last 168 hours. No results for input(s): LIPASE, AMYLASE in the last 168 hours. No results for input(s): AMMONIA in the last 168 hours. Coagulation Profile: Recent Labs  Lab 09/18/24 1835  INR 1.0   Cardiac Enzymes: No results for input(s): CKTOTAL, CKMB, CKMBINDEX, TROPONINI in the last 168 hours. ProBNP, BNP (last 5 results) No results for input(s): PROBNP, BNP in the last 8760 hours. HbA1C: No results for input(s): HGBA1C in the last 72 hours. CBG: Recent Labs  Lab 09/22/24 1148 09/22/24 1600 09/22/24 2058 09/23/24 0630 09/23/24 1112  GLUCAP 150* 154* 166* 134* 175*   Lipid Profile: No results for input(s): CHOL, HDL, LDLCALC, TRIG, CHOLHDL, LDLDIRECT in the last 72 hours. Thyroid  Function Tests: No results for input(s): TSH, T4TOTAL, FREET4, T3FREE, THYROIDAB in the last 72 hours. Anemia Panel: No results for input(s): VITAMINB12, FOLATE, FERRITIN, TIBC, IRON, RETICCTPCT in the last 72 hours. Sepsis Labs: No results for input(s): PROCALCITON, LATICACIDVEN in the last 168 hours.  Recent Results (from the past 240 hours)  Surgical pcr screen     Status: None   Collection Time: 09/19/24 12:38 AM   Specimen: Nasal Mucosa; Nasal Swab  Result Value Ref Range Status   MRSA, PCR NEGATIVE NEGATIVE Final   Staphylococcus aureus NEGATIVE NEGATIVE Final    Comment: (NOTE) The Xpert SA Assay (FDA approved for NASAL specimens in patients 26 years of age and older), is one component of a comprehensive surveillance program. It is not intended to diagnose infection nor to guide or monitor treatment. Performed at [facility], [address], [place], [place] [address]      Radiology Studies: No results found.  Scheduled Meds:  amLODipine   5 mg Oral  Daily   docusate sodium   100 mg Oral BID   insulin  aspart  0-6 Units Subcutaneous TID WC   loratadine   10 mg Oral Daily   senna  1 tablet Oral BID   Continuous Infusions:   LOS: 5 days   Norval Bar, MD  [facility]  09/23/2024, 1:34 PM   "

## 2024-09-23 NOTE — Telephone Encounter (Signed)
 Patient Product/process Development Scientist completed.    The patient is insured through [employer] . Patient has [employer], may use a copay card, and/or apply for patient assistance if available.    Ran test claim for Eliquis  2.5 mg and the current 30 day co-pay is $237.84 due to a $150.00 deductilbe.   This test claim was processed through [facility]- copay amounts may vary at other pharmacies due to pharmacy/plan contracts, or as the patient moves through the different stages of their insurance plan.     Reyes Sharps, CPHT Pharmacy Technician Patient Advocate Specialist Lead [facility] Patient Advocate Team Direct Number: [phone]  Fax: [phone]

## 2024-09-23 NOTE — TOC Initial Note (Signed)
 Transition of Care [facility]) - Initial/Assessment Note    Patient Details  Name: Jose Summers MRN: [id number] Date of Birth: 13-Dec-1960  Transition [facility]) CM/SW Contact:    Bridget Cordella Simmonds, LCSW Phone Number: 09/23/2024, 9:29 AM  Clinical Narrative:     CSW met with pt regarding PT recommendation for SNF.  Pt from home with wife Dedra, daughter Mccoy, permission given to speak with both of them.  No current home services.  Pt is agreeable to SNF, medicare choice document provided, permission given to send out referral in the hub.  Referral sent out in hub for SNF.              Expected Discharge Plan: Skilled Nursing Facility Barriers to Discharge: Continued Medical Work up, SNF Pending bed offer   Patient Goals and CMS Choice Patient states their goals for this hospitalization and ongoing recovery are:: get back home CMS Medicare.gov Compare Post Acute Care list provided to:: Patient Choice offered to / list presented to : Patient      Expected Discharge Plan and Services In-house Referral: Clinical Social Work   Post Acute Care Choice: Skilled Nursing Facility Living arrangements for the past 2 months: Single Family Home                                      Prior Living Arrangements/Services Living arrangements for the past 2 months: Single Family Home Lives with:: Adult Children, Spouse Patient language and need for interpreter reviewed:: Yes Do you feel safe going back to the place where you live?: Yes      Need for Family Participation in Patient Care: No (Comment) Care giver support system in place?: Yes (comment) Current home services: Other (comment) (none) Criminal Activity/Legal Involvement Pertinent to Current Situation/Hospitalization: No - Comment as needed  Activities of Daily Living   ADL Screening (condition at time of admission) Independently performs ADLs?: Yes (appropriate for developmental age) Is the patient deaf or have  difficulty hearing?: No Does the patient have difficulty seeing, even when wearing glasses/contacts?: No Does the patient have difficulty concentrating, remembering, or making decisions?: No  Permission Sought/Granted Permission sought to share information with : Family Supports Permission granted to share information with : Yes, Verbal Permission Granted  Share Information with NAME: wife Dedra, daughter Mccoy  Permission granted to share info w AGENCY: SNF        Emotional Assessment Appearance:: Appears stated age Attitude/Demeanor/Rapport: Engaged Affect (typically observed): Appropriate, Pleasant Orientation: : Oriented to Self, Oriented to Place, Oriented to  Time, Oriented to Situation      Admission diagnosis:  Closed fracture of left hip, initial encounter (HCC) [S72.002A] Closed left hip fracture, initial encounter (HCC) [S72.002A] Patient Active Problem List   Diagnosis Date Noted   Displaced intertrochanteric fracture of left femur, initial encounter for closed fracture (HCC) 09/18/2024   Type 2 diabetes mellitus with hypoglycemia (HCC) 09/23/2023   Hypomagnesemia 09/23/2023   Hyponatremia 09/23/2023   Essential hypertension 09/23/2023   Mixed hyperlipidemia 09/23/2023   Obesity (BMI 30-39.9) 09/23/2023   Failure to thrive in adult 09/23/2023   PCP:  Duanne Butler DASEN, MD Pharmacy:   [facility] - [place], [place] - [address] [place] [address] Phone: [phone] Fax: [phone]  [facility] [address] [place] [address] Phone: [phone] Fax: [phone]  Social Drivers of Health (SDOH) Social History: SDOH Screenings   Food Insecurity: No Food Insecurity (09/19/2024)  Housing: Low Risk (09/19/2024)  Transportation Needs: No Transportation Needs (09/19/2024)  Utilities: Not At Risk (09/19/2024)  Depression (PHQ2-9): Low Risk (12/27/2023)  Tobacco Use: Low  Risk (09/19/2024)   SDOH Interventions:     Readmission Risk Interventions     No data to display

## 2024-09-24 LAB — BASIC METABOLIC PANEL WITH GFR
Anion gap: 9 (ref 5–15)
BUN: 17 mg/dL (ref 8–23)
CO2: 28 mmol/L (ref 22–32)
Calcium: 9.1 mg/dL (ref 8.9–10.3)
Chloride: 92 mmol/L — ABNORMAL LOW (ref 98–111)
Creatinine, Ser: 0.93 mg/dL (ref 0.61–1.24)
GFR, Estimated: >60 mL/min
Glucose, Bld: 130 mg/dL — ABNORMAL HIGH (ref 70–99)
Potassium: 4.3 mmol/L (ref 3.5–5.1)
Sodium: 128 mmol/L — ABNORMAL LOW (ref 135–145)

## 2024-09-24 LAB — CBC
HCT: 23.7 % — ABNORMAL LOW (ref 39.0–52.0)
Hemoglobin: 8 g/dL — ABNORMAL LOW (ref 13.0–17.0)
MCH: 27.5 pg (ref 26.0–34.0)
MCHC: 33.8 g/dL (ref 30.0–36.0)
MCV: 81.4 fL (ref 80.0–100.0)
Platelets: 274 K/uL (ref 150–400)
RBC: 2.91 MIL/uL — ABNORMAL LOW (ref 4.22–5.81)
RDW: 16.1 % — ABNORMAL HIGH (ref 11.5–15.5)
WBC: 6.1 K/uL (ref 4.0–10.5)
nRBC: 0 % (ref 0.0–0.2)

## 2024-09-24 LAB — GLUCOSE, CAPILLARY
Glucose-Capillary: 128 mg/dL — ABNORMAL HIGH (ref 70–99)
Glucose-Capillary: 123 mg/dL — ABNORMAL HIGH (ref 70–99)
Glucose-Capillary: 165 mg/dL — ABNORMAL HIGH (ref 70–99)
Glucose-Capillary: 154 mg/dL — ABNORMAL HIGH (ref 70–99)

## 2024-09-24 NOTE — Progress Notes (Signed)
 " PROGRESS NOTE    Jose Summers  FMW:[id number] DOB: 27-Jan-1961 DOA: 09/18/2024 PCP: Duanne Butler DASEN, MD  Subjective: No acute events overnight. Seen and examined at bedside. Reports pain controlled with current meds. Tolerating oral intake without n/v. Denies constipation.   Hospital Course:  63 y.o. male with medical history significant of obese type II, hyperlipidemia, hypertension, chronic hyponatremia, psoriasis who presented after a mechanical fall and found to have a fracture of the left hip. Now s/p ORIF on 09/19/24. Post op, he developed acute anemia with Hb down to 6.7 (on arrival Hb was 11.4) and received 1U PRBC.    Assessment and Plan:  Closed left hip fracture, initial encounter --The patient is s/p intramedullary rod placement for closed fracture of the left hip. -- WBAT - started on eliquis  2.5mg  BID for post-op DVT prophylaxis recommendations -- PT/OT following -- anticipate rehab placement   Acute anemia  - Likely post op, he does have ecchymosis on the left thigh and left groin area - s/p 1U PRBC with Hb 7.7 post transfusion  - monitor CBC and transfuse for Hb<7 - if continues to drop and ecchymosis on the thigh expands, will get CT imaging of the leg and request ortho follow up    Hyponatremia - chronic, asymptomatic -Na 128 < 127. 130 on admission. Chart reviewed, patient hovers around high 120s to low 130s at baseline  - monitor clinically   Type 2 DM  -Sliding scale insulin    Hypertension - Was started on Norvasc  and lisinopril , but BP has been on the softer side.  - will hold lisinopril  for now    HLD - continue statin therapy   DVT prophylaxis: apixaban  (ELIQUIS ) tablet 2.5 mg Start: 09/23/24 1445 Place and maintain sequential compression device Start: 09/18/24 2217 apixaban  (ELIQUIS ) tablet 2.5 mg  Eliquis    Code Status: Full Code  Disposition Plan: SNF Reason for continuing need for hospitalization: medically ready, SNF placement  pending  Objective: Vitals:   09/23/24 1432 09/23/24 1932 09/24/24 0351 09/24/24 0739  BP: 120/74 113/81 117/74 115/73  Pulse: 86 92 84 85  Resp: 20 18 19 16   Temp: 99.1 F (37.3 C) 98.1 F (36.7 C) 98.2 F (36.8 C) 98.7 F (37.1 C)  TempSrc: Oral Oral Oral   SpO2: 96% 97% 95% 94%  Weight:      Height:        Intake/Output Summary (Last 24 hours) at 09/24/2024 1200 Last data filed at 09/24/2024 0616 Gross per 24 hour  Intake 240 ml  Output 1625 ml  Net -1385 ml   Filed Weights   09/18/24 1753 09/19/24 0000  Weight: 108.9 kg 100.5 kg    Examination:  Physical Exam Vitals and nursing note reviewed.  Constitutional:      General: He is not in acute distress.    Appearance: He is ill-appearing.  HENT:     Head: Normocephalic and atraumatic.  Cardiovascular:     Rate and Rhythm: Normal rate and regular rhythm.     Pulses: Normal pulses.     Heart sounds: Normal heart sounds.  Pulmonary:     Effort: Pulmonary effort is normal.     Breath sounds: Normal breath sounds.  Abdominal:     General: Bowel sounds are normal.     Palpations: Abdomen is soft.  Neurological:     Mental Status: He is alert.     Data Reviewed: I have personally reviewed following labs and imaging studies  CBC: Recent Labs  Lab 09/18/24 1835 09/20/24 0604 09/21/24 0337 09/22/24 0436 09/22/24 1339 09/23/24 0503 09/24/24 0354  WBC 5.5 9.4 9.2 7.9  --  6.4 6.1  NEUTROABS 3.7  --  7.1 5.2  --   --   --   HGB 11.4* 8.3* 7.1* 6.7* 7.7* 7.7* 8.0*  HCT 35.0* 25.2* 21.3* 20.3* 23.0* 22.6* 23.7*  MCV 80.1 80.0 80.4 81.5  --  80.1 81.4  PLT 269 205 190 210  --  249 274   Basic Metabolic Panel: Recent Labs  Lab 09/20/24 1909 09/21/24 0337 09/21/24 1915 09/23/24 0503 09/24/24 0354  NA 126* 128* 129* 127* 128*  K 4.4 4.5 4.4 4.6 4.3  CL 93* 94* 93* 92* 92*  CO2 27 26 28 27 28   GLUCOSE 121* 144* 136* 129* 130*  BUN 19 19 17 16 17   CREATININE 1.10 1.08 0.82 0.84 0.93  CALCIUM  8.3*  8.2* 8.8* 9.1 9.1   GFR: Estimated Creatinine Clearance: 96.2 mL/min (by C-G formula based on SCr of 0.93 mg/dL). Liver Function Tests: No results for input(s): AST, ALT, ALKPHOS, BILITOT, PROT, ALBUMIN in the last 168 hours. No results for input(s): LIPASE, AMYLASE in the last 168 hours. No results for input(s): AMMONIA in the last 168 hours. Coagulation Profile: Recent Labs  Lab 09/18/24 1835  INR 1.0   Cardiac Enzymes: No results for input(s): CKTOTAL, CKMB, CKMBINDEX, TROPONINI in the last 168 hours. ProBNP, BNP (last 5 results) No results for input(s): PROBNP, BNP in the last 8760 hours. HbA1C: No results for input(s): HGBA1C in the last 72 hours. CBG: Recent Labs  Lab 09/23/24 1112 09/23/24 1607 09/23/24 2125 09/24/24 0614 09/24/24 1135  GLUCAP 175* 132* 185* 128* 123*   Lipid Profile: No results for input(s): CHOL, HDL, LDLCALC, TRIG, CHOLHDL, LDLDIRECT in the last 72 hours. Thyroid  Function Tests: No results for input(s): TSH, T4TOTAL, FREET4, T3FREE, THYROIDAB in the last 72 hours. Anemia Panel: No results for input(s): VITAMINB12, FOLATE, FERRITIN, TIBC, IRON, RETICCTPCT in the last 72 hours. Sepsis Labs: No results for input(s): PROCALCITON, LATICACIDVEN in the last 168 hours.  Recent Results (from the past 240 hours)  Surgical pcr screen     Status: None   Collection Time: 09/19/24 12:38 AM   Specimen: Nasal Mucosa; Nasal Swab  Result Value Ref Range Status   MRSA, PCR NEGATIVE NEGATIVE Final   Staphylococcus aureus NEGATIVE NEGATIVE Final    Comment: (NOTE) The Xpert SA Assay (FDA approved for NASAL specimens in patients 35 years of age and older), is one component of a comprehensive surveillance program. It is not intended to diagnose infection nor to guide or monitor treatment. Performed at [facility], [address], [place], [place] [address]      Radiology  Studies: No results found.  Scheduled Meds:  amLODipine   5 mg Oral Daily   apixaban   2.5 mg Oral BID   docusate sodium   100 mg Oral BID   insulin  aspart  0-6 Units Subcutaneous TID WC   loratadine   10 mg Oral Daily   senna  1 tablet Oral BID   Continuous Infusions:   LOS: 6 days   Norval Bar, MD  [facility]  09/24/2024, 12:00 PM   "

## 2024-09-24 NOTE — TOC Progression Note (Signed)
 Transition of Care [facility]) - Progression Note    Patient Details  Name: Jose Summers MRN: [id number] Date of Birth: August 06, 1961  [facility]) CM/SW Contact  Inocente GORMAN Kindle, LCSW Phone Number: 09/24/2024, 2:42 PM  Clinical Narrative:    Patient sleeping upon CSW's visit. CSW left list of SNF bed offers and ratings at bedside. CSW contacted patient's daughter and answered questions. She stated she would review the list but also asked about home health. Someone from [facility] will touch base with them tomorrow.    Expected Discharge Plan: Skilled Nursing Facility Barriers to Discharge: Continued Medical Work up, SNF Pending bed offer               Expected Discharge Plan and Services In-house Referral: Clinical Social Work   Post Acute Care Choice: Skilled Nursing Facility Living arrangements for the past 2 months: Single Family Home                                       Social Drivers of Health (SDOH) Interventions SDOH Screenings   Food Insecurity: No Food Insecurity (09/19/2024)  Housing: Low Risk (09/19/2024)  Transportation Needs: No Transportation Needs (09/19/2024)  Utilities: Not At Risk (09/19/2024)  Depression (PHQ2-9): Low Risk (12/27/2023)  Tobacco Use: Low Risk (09/19/2024)    Readmission Risk Interventions     No data to display

## 2024-09-25 ENCOUNTER — Other Ambulatory Visit (HOSPITAL_COMMUNITY): Payer: Self-pay

## 2024-09-25 LAB — CBC
HCT: 23.9 % — ABNORMAL LOW (ref 39.0–52.0)
Hemoglobin: 8 g/dL — ABNORMAL LOW (ref 13.0–17.0)
MCH: 27 pg (ref 26.0–34.0)
MCHC: 33.5 g/dL (ref 30.0–36.0)
MCV: 80.7 fL (ref 80.0–100.0)
Platelets: 328 K/uL (ref 150–400)
RBC: 2.96 MIL/uL — ABNORMAL LOW (ref 4.22–5.81)
RDW: 16.4 % — ABNORMAL HIGH (ref 11.5–15.5)
WBC: 6.8 K/uL (ref 4.0–10.5)
nRBC: 0 % (ref 0.0–0.2)

## 2024-09-25 LAB — BASIC METABOLIC PANEL WITH GFR
Anion gap: 8 (ref 5–15)
BUN: 17 mg/dL (ref 8–23)
CO2: 28 mmol/L (ref 22–32)
Calcium: 9.3 mg/dL (ref 8.9–10.3)
Chloride: 93 mmol/L — ABNORMAL LOW (ref 98–111)
Creatinine, Ser: 0.98 mg/dL (ref 0.61–1.24)
GFR, Estimated: >60 mL/min
Glucose, Bld: 130 mg/dL — ABNORMAL HIGH (ref 70–99)
Potassium: 4.3 mmol/L (ref 3.5–5.1)
Sodium: 129 mmol/L — ABNORMAL LOW (ref 135–145)

## 2024-09-25 LAB — GLUCOSE, CAPILLARY
Glucose-Capillary: 125 mg/dL — ABNORMAL HIGH (ref 70–99)
Glucose-Capillary: 146 mg/dL — ABNORMAL HIGH (ref 70–99)
Glucose-Capillary: 133 mg/dL — ABNORMAL HIGH (ref 70–99)
Glucose-Capillary: 166 mg/dL — ABNORMAL HIGH (ref 70–99)

## 2024-09-25 MED ORDER — BISACODYL 10 MG RE SUPP
10.0000 mg | Freq: Every day | RECTAL | Status: DC | PRN
  Administered 2024-09-26: 10 mg via RECTAL
  Filled 2024-09-25: qty 1

## 2024-09-25 MED ORDER — SENNA 8.6 MG PO TABS
1.0000 | ORAL_TABLET | Freq: Two times a day (BID) | ORAL | 0 refills | Status: AC
  Filled 2024-09-25: qty 120, 60d supply, fill #0

## 2024-09-25 MED ORDER — POLYETHYLENE GLYCOL 3350 17 G PO PACK
17.0000 g | PACK | Freq: Every day | ORAL | Status: DC
  Administered 2024-09-25 – 2024-09-28 (×4): 17 g via ORAL
  Filled 2024-09-25 (×4): qty 1

## 2024-09-25 NOTE — Progress Notes (Addendum)
 Physical Therapy Treatment Patient Details Name: Jose Summers MRN: [id number] DOB: 03/04/1961 Today's Date: 09/25/2024   History of Present Illness Jose Summers is a 63 y.o. male admitted 12/19 after injuring left hip resulting in left hip fx. ORIF 12/20.  PMH: obese type II, hyperlipidemia, hypertension, chronic hyponatremia, psoriasis    PT Comments  Pt received in supine, agreeable to therapy session and with good participation and tolerance for transfer and pre-gait training at bedside. Pt with good participation in seated/supine and standing LLE exercises for strengthening, PTA also encouraging IS use with new spirometer brought to room since his was damaged. Pt needing up to minA to perform bed mobility with heavy reliance on elevated HOB and hospital bed rail (otherwise would need heavy trunk support) and transfers from elevated bed surface height and cushioned chair height with heavy effort. Pt standing tolerance and gait distance limited to a few feet forward/backward at side of bed, pt defers attempting to ambulate to bathroom or door of room for more functional distance with c/o moderate fatigue and severe pain while standing, despite premedication. Patient will benefit from continued inpatient follow up therapy, <3 hours/day, as he does not have consistent +2 physical assist at home during the day, since his family members work. If pt were to DC home instead, he would need max HH therapies and hospital bed/wheelchair and ramp to enter home at this time, as he is unable to ambulate or attempt stairs due to pain/fatigue. Addendum 12:15pm: PTA brought handouts on LLE HEP, english language handouts on stair negotiation with RW if x2 railings for stairs not present, and wheelchair mobility over stairs if family has to lift wheelchair up/down stairs for him, just in case. Do not have spanish lang handouts available on these items at time of session but pt reports some english fluency).   If  plan is discharge home, recommend the following: A lot of help with bathing/dressing/bathroom;Assistance with cooking/housework;Assist for transportation;Help with stairs or ramp for entrance;Two people to help with walking and/or transfers (+2 for gait, heavy +1 to stand)   Can travel by private vehicle     No  Equipment Recommendations  Rolling walker (2 wheels);BSC/3in1;Wheelchair (measurements PT);Wheelchair cushion (measurements PT);Other (comment) (at current level of function would also need hospital bed, pending progress)    Recommendations for Other Services       Precautions / Restrictions Precautions Precautions: Fall Recall of Precautions/Restrictions: Intact Precaution/Restrictions Comments: can communicate in English and Spanish, but when pain increased Spanish may be easier Restrictions Weight Bearing Restrictions Per Provider Order: Yes LLE Weight Bearing Per Provider Order: Weight bearing as tolerated     Mobility  Bed Mobility Overal bed mobility: Needs Assistance Bed Mobility: Supine to Sit     Supine to sit: Min assist, Used rails, HOB elevated     General bed mobility comments: Pt encouraged to try sitting up to L EOB without bed rail since he does not have hospital bed at home, but he defers to attempt, then raises HOB from 30 deg where PTA had lowered it to 46 degrees prior to sitting up wtih minA. CGA to trunk for stability while initially sitting, progressing to Supervision after pt scooted hips forward.    Transfers Overall transfer level: Needs assistance Equipment used: Rolling walker (2 wheels) Transfers: Sit to/from Stand, Bed to chair/wheelchair/BSC Sit to Stand: From elevated surface, Min assist   Step pivot transfers: From elevated surface, Min assist       General transfer  comment: Increased time/effort to transfer from seated posture to RW, mod cues for safe UE/LE placement. minA from slightly elevated bed and cushioned chair surfaces.     Ambulation/Gait Ambulation/Gait assistance: Min assist, Contact guard assist Gait Distance (Feet): 8 Feet Assistive device: Rolling walker (2 wheels) Gait Pattern/deviations: Step-to pattern, Decreased stance time - left, Decreased step length - right, Decreased weight shift to left     Pre-gait activities: standing hip flexion at RW x10 reps prior to above gait trial General Gait Details: Pivotal steps bed>chair on his L, seated break, then 59ft at bedside forward/backward. Heavy BUE reliance on RW, esp with L weight bearing while performing pre-gait tasks such as marching and forward/backward stepping at bedside. After forward/backward stepping ~41ft each direction in front of chair, (HOB to foot of bed, ~57ft), pt defers to ambulate additional distance (pt encouraged to cross to opposite side of bed prior to resting but he reports pain/fatigue too high). BP stable standing when checked prior to gait trial.   Stairs Stairs:  (pt not yet able to attempt, standing tolerance limited 2/2 pain/fatigue)           Wheelchair Mobility     Tilt Bed    Modified Rankin (Stroke Patients Only)       Balance Overall balance assessment: Needs assistance Sitting-balance support: Feet supported, Single extremity supported, No upper extremity supported Sitting balance-Leahy Scale: Fair Sitting balance - Comments: preferring UE support due to L hip pain   Standing balance support: Bilateral upper extremity supported, During functional activity, Reliant on assistive device for balance Standing balance-Leahy Scale: Poor Standing balance comment: heavy reliance on BUE on RW and gait belt support                            Communication Communication Communication: Impaired Factors Affecting Communication: Non - English speaking, interpreter not available (EASL; pt does not want interpreter, but PTA able to explain most concepts in Spanish for him PRN.)  Cognition Arousal:  Alert Behavior During Therapy: WFL for tasks assessed/performed   PT - Cognitive impairments: Problem solving, Safety/Judgement, Sequencing, Initiation, Attention                       PT - Cognition Comments: Pt seems to communicate better in Spanish when pain increased compared with English, PTA bilingual and able to explain concepts in Spanish for him PRN. Pt denies wanting interpreter for session. Pt encouraged to progress gait distance during session today but he reports he is unable, but in chair reports not much fatigue, with further discussion he reports moderate fatigue when encouraged to walk further in room if he is not that tired. Following commands: Impaired Following commands impaired: Follows one step commands with increased time, Only follows one step commands consistently    Cueing Cueing Techniques: Verbal cues, Tactile cues, Gestural cues  Exercises General Exercises - Lower Extremity Ankle Circles/Pumps: AROM, Both, 5 reps, Supine Short Arc Quad: AROM, AAROM, Left, 10 reps, Supine (needs AA to achieve full TKE, lacking a few degrees extension with AROM) Long Arc Quad: AROM, 5 reps, Seated, Left (pt reports compliance also while up in chair daily) Heel Slides: AROM, Left, 10 reps, Supine Hip ABduction/ADduction: AAROM, Left, 10 reps, Supine (AA due to friction from bed linens; limited AROM without AA) Hip Flexion/Marching: AROM, Both, 10 reps, Standing (at RW) Other Exercises Other Exercises: Pt pulling ~1,000 mL on IS but then  IS noted to have crack near top, new IS obtained and pt able to achieve higher levels with new device. Encouraged hourly use.    General Comments General comments (skin integrity, edema, etc.): SpO2 WFL on RA, BP stable supine, sitting, standing when checked today, no dizziness or nausea reported.      Pertinent Vitals/Pain Pain Assessment Pain Assessment: 0-10 Pain Score: 8  Pain Location: LLE in standing; 5/10 when resting in  supine/chair Pain Descriptors / Indicators: Aching, Grimacing, Sore, Discomfort, Operative site guarding Pain Intervention(s): Limited activity within patient's tolerance, Monitored during session, Premedicated before session, Repositioned, Other (comment) (pt defers ice pack for L hip)    Home Living                          Prior Function            PT Goals (current goals can now be found in the care plan section) Acute Rehab PT Goals Patient Stated Goal: To go home once moving on my own, family members all work full time PT Goal Formulation: With patient Time For Goal Achievement: 10/05/24 Progress towards PT goals: Progressing toward goals (slowly)    Frequency    Min 2X/week      PT Plan      Co-evaluation              AM-PAC PT 6 Clicks Mobility   Outcome Measure  Help needed turning from your back to your side while in a flat bed without using bedrails?: A Little Help needed moving from lying on your back to sitting on the side of a flat bed without using bedrails?: Total (w/o HOB elevated and rail pt unable to sit up with +1 assist) Help needed moving to and from a bed to a chair (including a wheelchair)?: A Little Help needed standing up from a chair using your arms (e.g., wheelchair or bedside chair)?: A Lot (less assist from higher bed surface) Help needed to walk in hospital room?: Total (<23ft) Help needed climbing 3-5 steps with a railing? : Total 6 Click Score: 11    End of Session Equipment Utilized During Treatment: Gait belt Activity Tolerance: Patient limited by fatigue;Patient limited by pain Patient left: in chair;with call bell/phone within reach;with chair alarm set;Other (comment) (pt defers ice pack; pillows behind lower legs, back and hips for comfort) Nurse Communication: Mobility status;Need for lift equipment;Precautions;Other (comment) (may be able to pivot with RW and +1 assist today, but can use Stedy if pain too  severe) PT Visit Diagnosis: Muscle weakness (generalized) (M62.81)     Time: 1115-1140 PT Time Calculation (min) (ACUTE ONLY): 25 min  Charges:    $Therapeutic Exercise: 8-22 mins $Therapeutic Activity: 8-22 mins PT General Charges $$ ACUTE PT VISIT: 1 Visit                     Luanna Weesner P., PTA Acute Rehabilitation Services Secure Chat Preferred 9a-5:30pm Office: [phone]    Connell HERO [facility] 09/25/2024, 12:00 PM

## 2024-09-25 NOTE — TOC Progression Note (Addendum)
 Transition of Care [facility]) - Progression Note    Patient Details  Name: Jose Summers MRN: [id number] Date of Birth: [birth year]  [facility] CM/SW Contact  Shaguana [place], [place] Phone Number: 09/25/2024, 11:56 AM  Clinical Narrative: Spoke to pt's dtr Mccoy re SNF choice and she is refusing at this time. Pt's dtr reports she lives with pt and has family support to provide 24/7 care at home. Dtr is agreeable to [facility] and reports pt has no DME in place. MD and CM updated.   1500: Pt's dtr is now requesting SNF. Provided current offers and she has accepted [facility]. Confirmed bed with Rhonda in admissions and she will start auth.   Julien Das, MSW, LCSW [phone] (coverage)        Expected Discharge Plan: Skilled Nursing Facility Barriers to Discharge: Continued Medical Work up, SNF Pending bed offer               Expected Discharge Plan and Services In-house Referral: Clinical Social Work   Post Acute Care Choice: Skilled Nursing Facility Living arrangements for the past 2 months: Single Family Home                                       Social Drivers of Health (SDOH) Interventions SDOH Screenings   Food Insecurity: No Food Insecurity (09/19/2024)  Housing: Low Risk (09/19/2024)  Transportation Needs: No Transportation Needs (09/19/2024)  Utilities: Not At Risk (09/19/2024)  Depression (PHQ2-9): Low Risk (12/27/2023)  Tobacco Use: Low Risk (09/19/2024)    Readmission Risk Interventions     No data to display

## 2024-09-25 NOTE — Plan of Care (Signed)
  Problem: Activity: Goal: Risk for activity intolerance will decrease Outcome: Progressing   Problem: Pain Managment: Goal: General experience of comfort will improve and/or be controlled Outcome: Progressing

## 2024-09-25 NOTE — Plan of Care (Signed)
  Problem: Coping: Goal: Ability to adjust to condition or change in health will improve Outcome: Progressing   Problem: Fluid Volume: Goal: Ability to maintain a balanced intake and output will improve Outcome: Progressing   Problem: Health Behavior/Discharge Planning: Goal: Ability to manage health-related needs will improve Outcome: Progressing   Problem: Metabolic: Goal: Ability to maintain appropriate glucose levels will improve Outcome: Progressing

## 2024-09-25 NOTE — Plan of Care (Signed)
" °  Problem: Fluid Volume: Goal: Ability to maintain a balanced intake and output will improve Outcome: Progressing   Problem: Health Behavior/Discharge Planning: Goal: Ability to manage health-related needs will improve Outcome: Progressing   Problem: Metabolic: Goal: Ability to maintain appropriate glucose levels will improve Outcome: Progressing   Problem: Education: Goal: Knowledge of General Education information will improve Description: Including pain rating scale, medication(s)/side effects and non-pharmacologic comfort measures Outcome: Progressing   "

## 2024-09-25 NOTE — Progress Notes (Signed)
 " PROGRESS NOTE    Jose Summers  FMW:[id number] DOB: [birth year] DOA: 09/18/2024 PCP: Duanne Butler DASEN, MD  Subjective:  No acute events overnight. Seen and examined at bedside. Reports pain controlled with current meds. Tolerating oral intake without n/v. Denies constipation.   Hospital Course:  63 y.o. male with medical history significant of obese type II, hyperlipidemia, hypertension, chronic hyponatremia, psoriasis who presented after a mechanical fall and found to have a fracture of the left hip. Now s/p ORIF on 09/19/24. Post op, he developed acute anemia with Hb down to 6.7 (on arrival Hb was 11.4) and received 1U PRBC.    Assessment and Plan:  Closed left hip fracture, initial encounter --The patient is s/p intramedullary rod placement for closed fracture of the left hip. -- WBAT - cont on eliquis  2.5mg  BID for post-op DVT prophylaxis recommendations -- PT/OT following -- anticipate rehab placement   Acute anemia  - Likely post op, he does have ecchymosis on the left thigh and left groin area - s/p 1U PRBC with Hb 7.7 post transfusion  - monitor CBC and transfuse for Hb<7 - if continues to drop and ecchymosis on the thigh expands, will get CT imaging of the leg and request ortho follow up    Hyponatremia - chronic, asymptomatic -Na 129 < 127. 130 on admission. Chart reviewed, patient hovers around high 120s to low 130s at baseline  - monitor clinically   Type 2 DM  -Sliding scale insulin    Hypertension - Was started on Norvasc  and lisinopril , but BP has been on the softer side.  - will hold lisinopril  for now    HLD - continue statin therapy  DVT prophylaxis: apixaban  (ELIQUIS ) tablet 2.5 mg Start: 09/23/24 1445 Place and maintain sequential compression device Start: 09/18/24 2217 apixaban  (ELIQUIS ) tablet 2.5 mg  Eliquis    Code Status: Full Code  Disposition Plan: SNF Reason for continuing need for hospitalization: medically ready, SNF placement  pending  Objective: Vitals:   09/24/24 0739 09/24/24 1502 09/24/24 1958 09/25/24 0727  BP: 115/73 106/72 113/75 120/75  Pulse: 85 97 85 79  Resp: 16 16 18 16   Temp: 98.7 F (37.1 C) 97.9 F (36.6 C) 98.3 F (36.8 C) 98 F (36.7 C)  TempSrc:   Oral   SpO2: 94% 99% 96% 96%  Weight:      Height:        Intake/Output Summary (Last 24 hours) at 09/25/2024 1408 Last data filed at 09/25/2024 0649 Gross per 24 hour  Intake 320 ml  Output 1725 ml  Net -1405 ml   Filed Weights   09/18/24 1753 09/19/24 0000  Weight: 108.9 kg 100.5 kg    Examination:  Physical Exam Vitals and nursing note reviewed.  Constitutional:      General: He is not in acute distress.    Appearance: He is ill-appearing.  HENT:     Head: Normocephalic and atraumatic.  Cardiovascular:     Rate and Rhythm: Normal rate and regular rhythm.     Pulses: Normal pulses.     Heart sounds: Normal heart sounds.  Pulmonary:     Effort: Pulmonary effort is normal.     Breath sounds: Normal breath sounds.  Abdominal:     General: Bowel sounds are normal.     Palpations: Abdomen is soft.  Neurological:     Mental Status: He is alert.     Data Reviewed: I have personally reviewed following labs and imaging studies  CBC: Recent Labs  Lab 09/18/24 1835 09/20/24 0604 09/21/24 0337 09/22/24 0436 09/22/24 1339 09/23/24 0503 09/24/24 0354 09/25/24 0615  WBC 5.5   < > 9.2 7.9  --  6.4 6.1 6.8  NEUTROABS 3.7  --  7.1 5.2  --   --   --   --   HGB 11.4*   < > 7.1* 6.7* 7.7* 7.7* 8.0* 8.0*  HCT 35.0*   < > 21.3* 20.3* 23.0* 22.6* 23.7* 23.9*  MCV 80.1   < > 80.4 81.5  --  80.1 81.4 80.7  PLT 269   < > 190 210  --  249 274 328   < > = values in this interval not displayed.   Basic Metabolic Panel: Recent Labs  Lab 09/21/24 0337 09/21/24 1915 09/23/24 0503 09/24/24 0354 09/25/24 0615  NA 128* 129* 127* 128* 129*  K 4.5 4.4 4.6 4.3 4.3  CL 94* 93* 92* 92* 93*  CO2 26 28 27 28 28   GLUCOSE 144* 136*  129* 130* 130*  BUN 19 17 16 17 17   CREATININE 1.08 0.82 0.84 0.93 0.98  CALCIUM  8.2* 8.8* 9.1 9.1 9.3   GFR: Estimated Creatinine Clearance: 91.3 mL/min (by C-G formula based on SCr of 0.98 mg/dL). Liver Function Tests: No results for input(s): AST, ALT, ALKPHOS, BILITOT, PROT, ALBUMIN in the last 168 hours. No results for input(s): LIPASE, AMYLASE in the last 168 hours. No results for input(s): AMMONIA in the last 168 hours. Coagulation Profile: Recent Labs  Lab 09/18/24 1835  INR 1.0   Cardiac Enzymes: No results for input(s): CKTOTAL, CKMB, CKMBINDEX, TROPONINI in the last 168 hours. ProBNP, BNP (last 5 results) No results for input(s): PROBNP, BNP in the last 8760 hours. HbA1C: No results for input(s): HGBA1C in the last 72 hours. CBG: Recent Labs  Lab 09/24/24 1135 09/24/24 1609 09/24/24 2144 09/25/24 0617 09/25/24 1118  GLUCAP 123* 165* 154* 125* 146*   Lipid Profile: No results for input(s): CHOL, HDL, LDLCALC, TRIG, CHOLHDL, LDLDIRECT in the last 72 hours. Thyroid  Function Tests: No results for input(s): TSH, T4TOTAL, FREET4, T3FREE, THYROIDAB in the last 72 hours. Anemia Panel: No results for input(s): VITAMINB12, FOLATE, FERRITIN, TIBC, IRON, RETICCTPCT in the last 72 hours. Sepsis Labs: No results for input(s): PROCALCITON, LATICACIDVEN in the last 168 hours.  Recent Results (from the past 240 hours)  Surgical pcr screen     Status: None   Collection Time: 09/19/24 12:38 AM   Specimen: Nasal Mucosa; Nasal Swab  Result Value Ref Range Status   MRSA, PCR NEGATIVE NEGATIVE Final   Staphylococcus aureus NEGATIVE NEGATIVE Final    Comment: (NOTE) The Xpert SA Assay (FDA approved for NASAL specimens in patients 53 years of age and older), is one component of a comprehensive surveillance program. It is not intended to diagnose infection nor to guide or monitor treatment. Performed at [facility], [address] [place], [place] [address]      Radiology Studies: No results found.  Scheduled Meds:  amLODipine   5 mg Oral Daily   apixaban   2.5 mg Oral BID   docusate sodium   100 mg Oral BID   insulin  aspart  0-6 Units Subcutaneous TID WC   loratadine   10 mg Oral Daily   senna  1 tablet Oral BID   Continuous Infusions:   LOS: 7 days   Norval Bar, MD  [facility]  09/25/2024, 2:08 PM   "

## 2024-09-25 NOTE — Hospital Course (Signed)
 63 y.o. male with medical history significant of obese type II, hyperlipidemia, hypertension, chronic hyponatremia, psoriasis who presented after a mechanical fall and found to have a fracture of the left hip. Now s/p ORIF on 09/19/24. Post op, he developed acute anemia with Hb down to 6.7 (on arrival Hb was 11.4) and received 1U PRBC.

## 2024-09-26 ENCOUNTER — Other Ambulatory Visit (HOSPITAL_COMMUNITY): Payer: Self-pay

## 2024-09-26 LAB — GLUCOSE, CAPILLARY
Glucose-Capillary: 130 mg/dL — ABNORMAL HIGH (ref 70–99)
Glucose-Capillary: 167 mg/dL — ABNORMAL HIGH (ref 70–99)
Glucose-Capillary: 182 mg/dL — ABNORMAL HIGH (ref 70–99)
Glucose-Capillary: 173 mg/dL — ABNORMAL HIGH (ref 70–99)

## 2024-09-26 NOTE — Progress Notes (Signed)
 Physical Therapy Treatment Patient Details Name: Jose Summers MRN: [id number] DOB: 12-21-1960 Today's Date: 09/26/2024   History of Present Illness Jose Summers is a 63 y.o. male admitted 12/19 after injuring left hip resulting in left hip fx. ORIF 12/20.  PMH: obese type II, hyperlipidemia, hypertension, chronic hyponatremia, psoriasis    PT Comments  Pt received ambulating out of bathroom and agreeable to session. Pt educated on importance of calling for staff assist with OOB mobility for safety. Pt continues to be limited by pain and relies heavily on BUE support to offload LLE. Pt able to tolerate 2 standing trials with static standing marches and another short gait trial, but requires seated rest breaks due to fatigue and pain. Patient will benefit from continued inpatient follow up therapy, <3 hours/day to maximize progress towards functional mobility goals prior to return home. Pt continues to benefit from PT services to progress toward functional mobility goals.    If plan is discharge home, recommend the following: A lot of help with bathing/dressing/bathroom;Assistance with cooking/housework;Assist for transportation;Help with stairs or ramp for entrance;Two people to help with walking and/or transfers   Can travel by private vehicle     No  Equipment Recommendations  Rolling walker (2 wheels);BSC/3in1;Wheelchair (measurements PT);Wheelchair cushion (measurements PT);Other (comment)    Recommendations for Other Services       Precautions / Restrictions Precautions Precautions: Fall Recall of Precautions/Restrictions: Intact Restrictions Weight Bearing Restrictions Per Provider Order: Yes LLE Weight Bearing Per Provider Order: Weight bearing as tolerated     Mobility  Bed Mobility               General bed mobility comments: Pt OOB upon entry    Transfers Overall transfer level: Needs assistance Equipment used: Rolling walker (2 wheels) Transfers: Sit  to/from Stand Sit to Stand: Contact guard assist           General transfer comment: From recliner with CGA for safety and increased time for rise. Heavy reliance on UE support    Ambulation/Gait Ambulation/Gait assistance: Contact guard assist Gait Distance (Feet): 8 Feet (+10) Assistive device: Rolling walker (2 wheels) Gait Pattern/deviations: Step-to pattern, Decreased stance time - left, Decreased step length - right, Decreased weight shift to left, Trunk flexed       General Gait Details: Heavy reliance on BUE support to offload LLE due to decreased WB tolerance. Cues for upright posture   Stairs             Wheelchair Mobility     Tilt Bed    Modified Rankin (Stroke Patients Only)       Balance Overall balance assessment: Needs assistance Sitting-balance support: Feet supported, Single extremity supported, No upper extremity supported Sitting balance-Leahy Scale: Fair     Standing balance support: Bilateral upper extremity supported, During functional activity, Reliant on assistive device for balance Standing balance-Leahy Scale: Poor Standing balance comment: heavy reliance on BUE on RW                            Communication Communication Communication: Impaired Factors Affecting Communication: Non - English speaking, interpreter not available (Pt able to communicate in English during session)  Cognition Arousal: Alert Behavior During Therapy: [facility] for tasks assessed/performed   PT - Cognitive impairments: Problem solving, Safety/Judgement, Sequencing, Initiation, Attention  Following commands: Impaired Following commands impaired: Follows one step commands with increased time, Only follows one step commands consistently    Cueing Cueing Techniques: Verbal cues, Tactile cues, Gestural cues  Exercises General Exercises - Lower Extremity Long Arc Quad: AROM, Seated, Left (x8) Hip Flexion/Marching:  AROM, Both, 10 reps, Standing    General Comments        Pertinent Vitals/Pain Pain Assessment Pain Assessment: Faces Faces Pain Scale: Hurts even more Pain Location: LLE increases with WB Pain Descriptors / Indicators: Aching, Grimacing, Sore, Discomfort, Operative site guarding Pain Intervention(s): Limited activity within patient's tolerance, Monitored during session, Repositioned     PT Goals (current goals can now be found in the care plan section) Acute Rehab PT Goals Patient Stated Goal: To go home once moving on my own, family members all work full time PT Goal Formulation: With patient Time For Goal Achievement: 10/05/24 Progress towards PT goals: Progressing toward goals    Frequency    Min 2X/week       AM-PAC PT 6 Clicks Mobility   Outcome Measure  Help needed turning from your back to your side while in a flat bed without using bedrails?: A Little Help needed moving from lying on your back to sitting on the side of a flat bed without using bedrails?: Total Help needed moving to and from a bed to a chair (including a wheelchair)?: A Little Help needed standing up from a chair using your arms (e.g., wheelchair or bedside chair)?: A Little Help needed to walk in hospital room?: Total (>33ft) Help needed climbing 3-5 steps with a railing? : Total 6 Click Score: 12    End of Session Equipment Utilized During Treatment: Gait belt Activity Tolerance: Patient limited by fatigue;Patient limited by pain Patient left: in chair;with call bell/phone within reach;with chair alarm set;with family/visitor present Nurse Communication: Mobility status PT Visit Diagnosis: Muscle weakness (generalized) (M62.81)     Time: 8542-8484 PT Time Calculation (min) (ACUTE ONLY): 18 min  Charges:    $Gait Training: 8-22 mins PT General Charges $$ ACUTE PT VISIT: 1 Visit                    Jose Summers, PTA Acute Rehabilitation Services Secure Chat Preferred   Office:([phone]) [phone]    Jose Summers 09/26/2024, 3:28 PM

## 2024-09-26 NOTE — Progress Notes (Signed)
 " PROGRESS NOTE    Jose Summers  FMW:[id number] DOB: 1961-06-07 DOA: 09/18/2024 PCP: Duanne Butler DASEN, MD  Subjective: No acute events overnight. Seen and examined at bedside. No new complaints. Pain controlled. Tolerating oral intake without n/v. Denies constipation.    Hospital Course: 63 y.o. male with medical history significant of obese type II, hyperlipidemia, hypertension, chronic hyponatremia, psoriasis who presented after a mechanical fall and found to have a fracture of the left hip. Now s/p ORIF on 09/19/24. Post op, he developed acute anemia with Hb down to 6.7 (on arrival Hb was 11.4) and received 1U PRBC.    Assessment and Plan:  Closed left hip fracture, initial encounter --The patient is s/p intramedullary rod placement for closed fracture of the left hip. -- WBAT - cont on eliquis  2.5mg  BID for post-op DVT prophylaxis recommendations -- PT/OT following -- anticipate rehab placement   Acute anemia  - Likely post op, he does have ecchymosis on the left thigh and left groin area - s/p 1U PRBC with Hb 7.7 post transfusion  - monitor CBC and transfuse for Hb<7 - if continues to drop and ecchymosis on the thigh expands, will get CT imaging of the leg and request ortho follow up    Hyponatremia - chronic, asymptomatic -Na 129 < 127. 130 on admission. Chart reviewed, patient hovers around high 120s to low 130s at baseline  - monitor clinically   Type 2 DM  -Sliding scale insulin    Hypertension - Was started on Norvasc  and lisinopril , but BP has been on the softer side.  - will hold lisinopril  for now    HLD - continue statin therapy  DVT prophylaxis: apixaban  (ELIQUIS ) tablet 2.5 mg Start: 09/23/24 1445 Place and maintain sequential compression device Start: 09/18/24 2217 apixaban  (ELIQUIS ) tablet 2.5 mg  Eliquis    Code Status: Full Code  Disposition Plan: SNF Reason for continuing need for hospitalization: medically ready, SNF placement  pending  Objective: Vitals:   09/25/24 1421 09/25/24 1935 09/26/24 0416 09/26/24 0730  BP: 128/82 116/72 106/66 113/76  Pulse: 96 91 72 75  Resp: 16 18 18 16   Temp: 97.8 F (36.6 C) 99.3 F (37.4 C) 98.2 F (36.8 C) 98.6 F (37 C)  TempSrc:  Oral Oral   SpO2: 98% 97% 97% 98%  Weight:      Height:        Intake/Output Summary (Last 24 hours) at 09/26/2024 1240 Last data filed at 09/26/2024 0900 Gross per 24 hour  Intake 0 ml  Output --  Net 0 ml   Filed Weights   09/18/24 1753 09/19/24 0000  Weight: 108.9 kg 100.5 kg    Examination:  Physical Exam Vitals and nursing note reviewed.  Constitutional:      General: He is not in acute distress.    Appearance: He is obese. He is ill-appearing.     Comments: Weak, frail  HENT:     Head: Normocephalic and atraumatic.  Cardiovascular:     Rate and Rhythm: Normal rate and regular rhythm.     Pulses: Normal pulses.     Heart sounds: Normal heart sounds.  Pulmonary:     Effort: Pulmonary effort is normal.     Breath sounds: Normal breath sounds.  Abdominal:     General: Bowel sounds are normal.     Palpations: Abdomen is soft.  Neurological:     Mental Status: He is alert.     Data Reviewed: I have personally reviewed following labs  and imaging studies  CBC: Recent Labs  Lab 09/21/24 0337 09/22/24 0436 09/22/24 1339 09/23/24 0503 09/24/24 0354 09/25/24 0615  WBC 9.2 7.9  --  6.4 6.1 6.8  NEUTROABS 7.1 5.2  --   --   --   --   HGB 7.1* 6.7* 7.7* 7.7* 8.0* 8.0*  HCT 21.3* 20.3* 23.0* 22.6* 23.7* 23.9*  MCV 80.4 81.5  --  80.1 81.4 80.7  PLT 190 210  --  249 274 328   Basic Metabolic Panel: Recent Labs  Lab 09/21/24 0337 09/21/24 1915 09/23/24 0503 09/24/24 0354 09/25/24 0615  NA 128* 129* 127* 128* 129*  K 4.5 4.4 4.6 4.3 4.3  CL 94* 93* 92* 92* 93*  CO2 26 28 27 28 28   GLUCOSE 144* 136* 129* 130* 130*  BUN 19 17 16 17 17   CREATININE 1.08 0.82 0.84 0.93 0.98  CALCIUM  8.2* 8.8* 9.1 9.1 9.3    GFR: Estimated Creatinine Clearance: 91.3 mL/min (by C-G formula based on SCr of 0.98 mg/dL). Liver Function Tests: No results for input(s): AST, ALT, ALKPHOS, BILITOT, PROT, ALBUMIN in the last 168 hours. No results for input(s): LIPASE, AMYLASE in the last 168 hours. No results for input(s): AMMONIA in the last 168 hours. Coagulation Profile: No results for input(s): INR, PROTIME in the last 168 hours. Cardiac Enzymes: No results for input(s): CKTOTAL, CKMB, CKMBINDEX, TROPONINI in the last 168 hours. ProBNP, BNP (last 5 results) No results for input(s): PROBNP, BNP in the last 8760 hours. HbA1C: No results for input(s): HGBA1C in the last 72 hours. CBG: Recent Labs  Lab 09/25/24 1118 09/25/24 1640 09/25/24 2117 09/26/24 0520 09/26/24 1200  GLUCAP 146* 133* 166* 130* 167*   Lipid Profile: No results for input(s): CHOL, HDL, LDLCALC, TRIG, CHOLHDL, LDLDIRECT in the last 72 hours. Thyroid  Function Tests: No results for input(s): TSH, T4TOTAL, FREET4, T3FREE, THYROIDAB in the last 72 hours. Anemia Panel: No results for input(s): VITAMINB12, FOLATE, FERRITIN, TIBC, IRON, RETICCTPCT in the last 72 hours. Sepsis Labs: No results for input(s): PROCALCITON, LATICACIDVEN in the last 168 hours.  Recent Results (from the past 240 hours)  Surgical pcr screen     Status: None   Collection Time: 09/19/24 12:38 AM   Specimen: Nasal Mucosa; Nasal Swab  Result Value Ref Range Status   MRSA, PCR NEGATIVE NEGATIVE Final   Staphylococcus aureus NEGATIVE NEGATIVE Final    Comment: (NOTE) The Xpert SA Assay (FDA approved for NASAL specimens in patients 94 years of age and older), is one component of a comprehensive surveillance program. It is not intended to diagnose infection nor to guide or monitor treatment. Performed at [facility], [address] [place], [place] [address]      Radiology  Studies: No results found.  Scheduled Meds:  amLODipine   5 mg Oral Daily   apixaban   2.5 mg Oral BID   insulin  aspart  0-6 Units Subcutaneous TID WC   loratadine   10 mg Oral Daily   polyethylene glycol  17 g Oral Daily   senna  1 tablet Oral BID   Continuous Infusions:   LOS: 8 days   Norval Bar, MD  [facility]  09/26/2024, 12:40 PM   "

## 2024-09-26 NOTE — Progress Notes (Signed)
 CSW contacted Shona with [facility] who stated patients insurance shara is still pending.

## 2024-09-26 NOTE — Plan of Care (Signed)
   Problem: Education: Goal: Ability to describe self-care measures that may prevent or decrease complications (Diabetes Survival Skills Education) will improve Outcome: Progressing Goal: Individualized Educational Video(s) Outcome: Progressing

## 2024-09-27 LAB — GLUCOSE, CAPILLARY
Glucose-Capillary: 120 mg/dL — ABNORMAL HIGH (ref 70–99)
Glucose-Capillary: 115 mg/dL — ABNORMAL HIGH (ref 70–99)

## 2024-09-27 NOTE — Plan of Care (Signed)
  Problem: Coping: Goal: Ability to adjust to condition or change in health will improve Outcome: Progressing   Problem: Fluid Volume: Goal: Ability to maintain a balanced intake and output will improve Outcome: Progressing   Problem: Health Behavior/Discharge Planning: Goal: Ability to manage health-related needs will improve Outcome: Progressing   Problem: Metabolic: Goal: Ability to maintain appropriate glucose levels will improve Outcome: Progressing

## 2024-09-27 NOTE — Plan of Care (Signed)
   Problem: Education: Goal: Ability to describe self-care measures that may prevent or decrease complications (Diabetes Survival Skills Education) will improve Outcome: Progressing Goal: Individualized Educational Video(s) Outcome: Progressing   Problem: Coping: Goal: Ability to adjust to condition or change in health will improve Outcome: Progressing

## 2024-09-27 NOTE — Progress Notes (Signed)
 " PROGRESS NOTE    Jose Summers  FMW:[id number] DOB: 04-24-1961 DOA: 09/18/2024 PCP: Duanne Butler DASEN, MD  Subjective:  No acute events overnight. Seen and examined at bedside. No new complaints. Pain controlled. Tolerating oral intake without n/v. Denies constipation.   Hospital Course: 63 y.o. male with medical history significant of obese type II, hyperlipidemia, hypertension, chronic hyponatremia, psoriasis who presented after a mechanical fall and found to have a fracture of the left hip. Now s/p ORIF on 09/19/24. Post op, he developed acute anemia with Hb down to 6.7 (on arrival Hb was 11.4) and received 1U PRBC.    Assessment and Plan:  Closed left hip fracture, initial encounter --The patient is s/p intramedullary rod placement for closed fracture of the left hip. -- WBAT - cont on eliquis  2.5mg  BID for post-op DVT prophylaxis recommendations -- PT/OT following -- anticipate rehab placement   Acute anemia  - Likely post op, he does have ecchymosis on the left thigh and left groin area - s/p 1U PRBC with Hb 7.7 post transfusion  - monitor CBC and transfuse for Hb<7 - if continues to drop and ecchymosis on the thigh expands, will get CT imaging of the leg and request ortho follow up    Hyponatremia - chronic, asymptomatic -Na 129 < 127. 130 on admission. Chart reviewed, patient hovers around high 120s to low 130s at baseline  - monitor clinically   Type 2 DM  -Sliding scale insulin    Hypertension - Was started on Norvasc  and lisinopril , but BP has been on the softer side.  - will hold lisinopril  for now    HLD - continue statin therapy  DVT prophylaxis: apixaban  (ELIQUIS ) tablet 2.5 mg Start: 09/23/24 1445 Place and maintain sequential compression device Start: 09/18/24 2217 apixaban  (ELIQUIS ) tablet 2.5 mg      Code Status: Full Code  Disposition Plan: SNF Reason for continuing need for hospitalization: medically ready, SNF placement  pending  Objective: Vitals:   09/26/24 0416 09/26/24 0730 09/26/24 2156 09/27/24 0727  BP: 106/66 113/76 117/67 132/76  Pulse: 72 75 71 80  Resp: 18 16 15    Temp: 98.2 F (36.8 C) 98.6 F (37 C) 98.3 F (36.8 C) 98.7 F (37.1 C)  TempSrc: Oral   Oral  SpO2: 97% 98%  98%  Weight:      Height:        Intake/Output Summary (Last 24 hours) at 09/27/2024 1144 Last data filed at 09/26/2024 2200 Gross per 24 hour  Intake 460 ml  Output 700 ml  Net -240 ml   Filed Weights   09/18/24 1753 09/19/24 0000  Weight: 108.9 kg 100.5 kg    Examination:  Physical Exam Vitals and nursing note reviewed.  Constitutional:      General: He is not in acute distress.    Appearance: He is obese. He is not ill-appearing.  HENT:     Head: Normocephalic and atraumatic.  Cardiovascular:     Rate and Rhythm: Normal rate and regular rhythm.     Pulses: Normal pulses.     Heart sounds: Normal heart sounds.  Pulmonary:     Effort: Pulmonary effort is normal.     Breath sounds: Normal breath sounds.  Abdominal:     General: Bowel sounds are normal.     Palpations: Abdomen is soft.  Neurological:     Mental Status: He is alert.     Data Reviewed: I have personally reviewed following labs and imaging studies  CBC: Recent Labs  Lab 09/21/24 0337 09/22/24 0436 09/22/24 1339 09/23/24 0503 09/24/24 0354 09/25/24 0615  WBC 9.2 7.9  --  6.4 6.1 6.8  NEUTROABS 7.1 5.2  --   --   --   --   HGB 7.1* 6.7* 7.7* 7.7* 8.0* 8.0*  HCT 21.3* 20.3* 23.0* 22.6* 23.7* 23.9*  MCV 80.4 81.5  --  80.1 81.4 80.7  PLT 190 210  --  249 274 328   Basic Metabolic Panel: Recent Labs  Lab 09/21/24 0337 09/21/24 1915 09/23/24 0503 09/24/24 0354 09/25/24 0615  NA 128* 129* 127* 128* 129*  K 4.5 4.4 4.6 4.3 4.3  CL 94* 93* 92* 92* 93*  CO2 26 28 27 28 28   GLUCOSE 144* 136* 129* 130* 130*  BUN 19 17 16 17 17   CREATININE 1.08 0.82 0.84 0.93 0.98  CALCIUM  8.2* 8.8* 9.1 9.1 9.3   GFR: Estimated  Creatinine Clearance: 91.3 mL/min (by C-G formula based on SCr of 0.98 mg/dL). Liver Function Tests: No results for input(s): AST, ALT, ALKPHOS, BILITOT, PROT, ALBUMIN in the last 168 hours. No results for input(s): LIPASE, AMYLASE in the last 168 hours. No results for input(s): AMMONIA in the last 168 hours. Coagulation Profile: No results for input(s): INR, PROTIME in the last 168 hours. Cardiac Enzymes: No results for input(s): CKTOTAL, CKMB, CKMBINDEX, TROPONINI in the last 168 hours. ProBNP, BNP (last 5 results) No results for input(s): PROBNP, BNP in the last 8760 hours. HbA1C: No results for input(s): HGBA1C in the last 72 hours. CBG: Recent Labs  Lab 09/26/24 1200 09/26/24 1600 09/26/24 2133 09/27/24 0610 09/27/24 1118  GLUCAP 167* 182* 173* 120* 115*   Lipid Profile: No results for input(s): CHOL, HDL, LDLCALC, TRIG, CHOLHDL, LDLDIRECT in the last 72 hours. Thyroid  Function Tests: No results for input(s): TSH, T4TOTAL, FREET4, T3FREE, THYROIDAB in the last 72 hours. Anemia Panel: No results for input(s): VITAMINB12, FOLATE, FERRITIN, TIBC, IRON, RETICCTPCT in the last 72 hours. Sepsis Labs: No results for input(s): PROCALCITON, LATICACIDVEN in the last 168 hours.  Recent Results (from the past 240 hours)  Surgical pcr screen     Status: None   Collection Time: 09/19/24 12:38 AM   Specimen: Nasal Mucosa; Nasal Swab  Result Value Ref Range Status   MRSA, PCR NEGATIVE NEGATIVE Final   Staphylococcus aureus NEGATIVE NEGATIVE Final    Comment: (NOTE) The Xpert SA Assay (FDA approved for NASAL specimens in patients 64 years of age and older), is one component of a comprehensive surveillance program. It is not intended to diagnose infection nor to guide or monitor treatment. Performed at [facility], [address] [place], [place] [address]      Radiology Studies: No results  found.  Scheduled Meds:  amLODipine   5 mg Oral Daily   apixaban   2.5 mg Oral BID   loratadine   10 mg Oral Daily   polyethylene glycol  17 g Oral Daily   senna  1 tablet Oral BID   Continuous Infusions:   LOS: 9 days   Norval Bar, MD  [facility]  09/27/2024, 11:44 AM   "

## 2024-09-28 MED ORDER — HYDROCODONE-ACETAMINOPHEN 5-325 MG PO TABS: 1.0000 | ORAL_TABLET | Freq: Three times a day (TID) | ORAL | 0 refills | Status: AC | PRN

## 2024-09-28 NOTE — Plan of Care (Addendum)
 Patient AVS and scripts in DC packet. @1425  Attempt to call [facility] x2, no answer.   Problem: Education: Goal: Ability to describe self-care measures that may prevent or decrease complications (Diabetes Survival Skills Education) will improve Outcome: Progressing   Problem: Education: Goal: Knowledge of General Education information will improve Description: Including pain rating scale, medication(s)/side effects and non-pharmacologic comfort measures Outcome: Progressing   Problem: Activity: Goal: Risk for activity intolerance will decrease Outcome: Progressing   Problem: Pain Managment: Goal: General experience of comfort will improve and/or be controlled Outcome: Progressing   Problem: Safety: Goal: Ability to remain free from injury will improve Outcome: Progressing   Problem: Skin Integrity: Goal: Risk for impaired skin integrity will decrease Outcome: Progressing

## 2024-09-28 NOTE — TOC Transition Note (Addendum)
 Transition of Care [facility]) - Discharge Note   Patient Details  Name: Jose Summers MRN: [id number] Date of Birth: 08/16/1961  [facility] CM/SW Contact:  Luann SHAUNNA Cumming, LCSW Phone Number: 09/28/2024, 2:15 PM   Clinical Narrative:      Per MD patient ready for DC to [facility]. RN, patient, and facility notified of DC. Discharge Summary and FL2 sent to facility. RN to call report prior to discharge [phone]). DC packet on chart. Ambulance transport requested for patient.   CSW will sign off for now as social work intervention is no longer needed. Please consult us  again if new needs arise.    Final next level of care: Skilled Nursing Facility Barriers to Discharge: No Barriers Identified   Patient Goals and CMS Choice Patient states their goals for this hospitalization and ongoing recovery are:: get back home CMS Medicare.gov Compare Post Acute Care list provided to:: Patient Choice offered to / list presented to : Patient      Discharge Placement              Patient chooses bed at: [facility] Patient to be transferred to facility by: [facility]   Patient and family notified of of transfer: 09/28/24  Discharge Plan and Services Additional resources added to the After Visit Summary for   In-house Referral: Clinical Social Work   Post Acute Care Choice: Skilled Nursing Facility                               Social Drivers of Health (SDOH) Interventions SDOH Screenings   Food Insecurity: No Food Insecurity (09/19/2024)  Housing: Low Risk (09/19/2024)  Transportation Needs: No Transportation Needs (09/19/2024)  Utilities: Not At Risk (09/19/2024)  Depression (PHQ2-9): Low Risk (12/27/2023)  Tobacco Use: Low Risk (09/19/2024)     Readmission Risk Interventions     No data to display

## 2024-09-28 NOTE — Discharge Summary (Addendum)
 " [facility] Phys[facility] Discharge Summary   Patient name: Jose Summers  Admit date:     09/18/2024  Discharge date: 09/28/2024  Attending Physician: LORREN BRADLY POUR [[id number]]  Discharge Physician: Norval Bar   PCP: Duanne Butler DASEN, MD  Admitted From: Home  Disposition:  [facility]  Recommendations for Outpatient Follow-up:  Follow up with PCP in 1-2 weeks Follow up with orthopedic surgery in 2 weeks  Home Health:No Equipment/Devices: @ECDMELIST @  Discharge Condition:Stable CODE STATUS:FULL Diet recommendation: Heart Healthy/Diabetic Fluid Restriction: None  Hospital Summary: 63 y.o. male with medical history significant of obese, prediabetes (not Type 2 DM), hyperlipidemia, hypertension, chronic hyponatremia, psoriasis who presented after a mechanical fall and found to have a fracture of the left hip. Now s/p ORIF on 09/19/24. Post op, he developed acute anemia with Hb down to 6.7 (on arrival Hb was 11.4) and received 1U PRBC. Remained stable thereafter. Started on eliquis  2.5mg  BID for post-op DVT prophylaxis. Seen by PT this admission. Stable for discharge to SNF and will need to follow up with orthopedic surgery as outpatient.  Discharge Diagnoses:  Principal Problem:   Displaced intertrochanteric fracture of left femur, initial encounter for closed fracture [facility]) Active Problems:   Type 2 diabetes mellitus with hypoglycemia (HCC)   Essential hypertension   Mixed hyperlipidemia   Obesity (BMI 30-39.9)   Discharge Instructions  Discharge Instructions     AMB referral to orthopedics   Complete by: As directed    Change dressing (specify)   Complete by: As directed    Dressing change: as needed. Follow up with orthopedic surgery for dressing recommendations   Diet general   Complete by: As directed    Discharge wound care:   Complete by: As directed    Follow up with orthopedic surgery for wound care   For home use only DME Access ramp    Complete by: As directed    For home use only DME Hospital bed   Complete by: As directed    Length of Need: 6 Months   Bed type: Semi-electric   For home use only DME standard manual wheelchair with seat cushion   Complete by: As directed    Patient suffers from L femoral fracture which impairs their ability to perform daily activities like bathing, dressing, feeding, grooming, and toileting in the home.  A walker will not resolve issue with performing activities of daily living. A wheelchair will allow patient to safely perform daily activities. Patient can safely propel the wheelchair in the home or has a caregiver who can provide assistance. Length of need 6 months . Accessories: elevating leg rests (ELRs), wheel locks, extensions and anti-tippers.   Increase activity slowly   Complete by: As directed    Increase activity slowly   Complete by: As directed    Weight bearing as tolerated   Complete by: As directed       Allergies as of 09/28/2024   No Known Allergies      Medication List     STOP taking these medications    amLODipine  5 MG tablet Commonly known as: NORVASC    lisinopril  40 MG tablet Commonly known as: ZESTRIL        TAKE these medications    apixaban  2.5 MG Tabs tablet Commonly known as: ELIQUIS  Take 1 tablet (2.5 mg total) by mouth 2 (two) times daily.   atorvastatin  40 MG tablet Commonly known as: LIPITOR TAKE 1 TABLET EVERY DAY BY MOUTH AT BEDTIME FOR 90  DAYS.   HYDROcodone -acetaminophen  5-325 MG tablet Commonly known as: NORCO/VICODIN Take 1 tablet by mouth every 8 (eight) hours as needed for moderate pain (pain score 4-6).   metFORMIN  500 MG tablet Commonly known as: GLUCOPHAGE  Take 500 mg by mouth daily.   senna 8.6 MG Tabs tablet Commonly known as: SENOKOT Take 1 tablet (8.6 mg total) by mouth 2 (two) times daily.               Durable Medical Equipment  (From admission, onward)           Start     Ordered   09/25/24  1313  DME Walker  Once       Question Answer Comment  Walker: With 5 Inch Wheels   Patient needs a walker to treat with the following condition Physical debility      09/25/24 1323   09/25/24 1313  DME 3-in-1  Once        09/25/24 1323   09/25/24 0000  For home use only DME Hospital bed       Question Answer Comment  Length of Need 6 Months   Bed type Semi-electric      09/25/24 1323   09/25/24 0000  For home use only DME Access ramp        09/25/24 1323   09/25/24 0000  For home use only DME standard manual wheelchair with seat cushion       Comments: Patient suffers from L femoral fracture which impairs their ability to perform daily activities like bathing, dressing, feeding, grooming, and toileting in the home.  A walker will not resolve issue with performing activities of daily living. A wheelchair will allow patient to safely perform daily activities. Patient can safely propel the wheelchair in the home or has a caregiver who can provide assistance. Length of need 6 months . Accessories: elevating leg rests (ELRs), wheel locks, extensions and anti-tippers.   09/25/24 1323              Discharge Care Instructions  (From admission, onward)           Start     Ordered   09/28/24 0000  Discharge wound care:       Comments: Follow up with orthopedic surgery for wound care   09/28/24 1321   09/25/24 0000  Change dressing (specify)       Comments: Dressing change: as needed. Follow up with orthopedic surgery for dressing recommendations   09/25/24 1323   09/19/24 0000  Weight bearing as tolerated        09/19/24 1047            Contact information for follow-up providers     Jule Ronal CROME, PA-C. Schedule an appointment as soon as possible for a visit in 2 week(s).   Specialty: Orthopedic Surgery Contact information: [address] [place]  [place] [address] [phone]              Contact information for after-discharge care     Destination      [facility]   Service: Skilled Nursing Contact information: [address] [place]  [address] [phone]                    Allergies[1]  Discharge Exam: Vitals:   09/28/24 0510 09/28/24 0812  BP: 127/81 115/71  Pulse: 77 84  Resp: 14 17  Temp: 97.9 F (36.6 C) 98.2 F (36.8 C)  SpO2: 98%  96%    Physical Exam Vitals and nursing note reviewed.  Constitutional:      General: He is not in acute distress.    Appearance: He is obese. He is not ill-appearing.     Comments: Weak, frail  HENT:     Head: Normocephalic and atraumatic.  Cardiovascular:     Rate and Rhythm: Normal rate and regular rhythm.     Pulses: Normal pulses.     Heart sounds: Normal heart sounds.  Pulmonary:     Effort: Pulmonary effort is normal.     Breath sounds: Normal breath sounds.  Abdominal:     General: Bowel sounds are normal.     Palpations: Abdomen is soft.  Neurological:     Mental Status: He is alert. Mental status is at baseline.     The results of significant diagnostics from this hospitalization (including imaging, microbiology, ancillary and laboratory) are listed below for reference.    Microbiology: Recent Results (from the past 240 hours)  Surgical pcr screen     Status: None   Collection Time: 09/19/24 12:38 AM   Specimen: Nasal Mucosa; Nasal Swab  Result Value Ref Range Status   MRSA, PCR NEGATIVE NEGATIVE Final   Staphylococcus aureus NEGATIVE NEGATIVE Final    Comment: (NOTE) The Xpert SA Assay (FDA approved for NASAL specimens in patients 15 years of age and older), is one component of a comprehensive surveillance program. It is not intended to diagnose infection nor to guide or monitor treatment. Performed at [facility], [address], [place], [place] [address]      Labs: ProBNP, BNP (last 5 results) No results for input(s): PROBNP, BNP in the last 8760 hours. Basic Metabolic Panel: Recent  Labs  Lab 09/21/24 1915 09/23/24 0503 09/24/24 0354 09/25/24 0615  NA 129* 127* 128* 129*  K 4.4 4.6 4.3 4.3  CL 93* 92* 92* 93*  CO2 28 27 28 28   GLUCOSE 136* 129* 130* 130*  BUN 17 16 17 17   CREATININE 0.82 0.84 0.93 0.98  CALCIUM  8.8* 9.1 9.1 9.3   Liver Function Tests: No results for input(s): AST, ALT, ALKPHOS, BILITOT, PROT, ALBUMIN in the last 168 hours. No results for input(s): LIPASE, AMYLASE in the last 168 hours. No results for input(s): AMMONIA in the last 168 hours. CBC: Recent Labs  Lab 09/22/24 0436 09/22/24 1339 09/23/24 0503 09/24/24 0354 09/25/24 0615  WBC 7.9  --  6.4 6.1 6.8  NEUTROABS 5.2  --   --   --   --   HGB 6.7* 7.7* 7.7* 8.0* 8.0*  HCT 20.3* 23.0* 22.6* 23.7* 23.9*  MCV 81.5  --  80.1 81.4 80.7  PLT 210  --  249 274 328   Cardiac Enzymes: No results for input(s): CKTOTAL, CKMB, CKMBINDEX, TROPONINI, TROPONINIHS in the last 168 hours. BNP: No results for input(s): BNP in the last 168 hours. CBG: Recent Labs  Lab 09/26/24 1200 09/26/24 1600 09/26/24 2133 09/27/24 0610 09/27/24 1118  GLUCAP 167* 182* 173* 120* 115*   D-Dimer No results for input(s): DDIMER in the last 72 hours. Hgb A1c No results for input(s): HGBA1C in the last 72 hours. Lipid Profile No results for input(s): CHOL, HDL, LDLCALC, TRIG, CHOLHDL, LDLDIRECT in the last 72 hours. Thyroid  function studies No results for input(s): TSH, T4TOTAL, FREET4, T3FREE, THYROIDAB in the last 72 hours.  Invalid input(s): FREET3 Anemia work up No results for input(s): VITAMINB12, FOLATE, FERRITIN, TIBC, IRON, RETICCTPCT in the last  72 hours. Urinalysis No results found for: COLORURINE, APPEARANCEUR, LABSPEC, PHURINE, GLUCOSEU, HGBUR, BILIRUBINUR, KETONESUR, PROTEINUR, UROBILINOGEN, NITRITE, LEUKOCYTESUR Sepsis Labs Recent Labs  Lab 09/22/24 0436 09/23/24 0503 09/24/24 0354  09/25/24 0615  WBC 7.9 6.4 6.1 6.8    Procedures/Studies: DG HIP PORT UNILAT WITH PELVIS 1V LEFT Result Date: 09/19/2024 EXAM: 1 VIEW(S) XRAY OF THE PELVIS AND LEFT HIP 09/19/2024 10:55:00 PM COMPARISON: None available. CLINICAL HISTORY: Deficient knowledge of open reduction and internal (ORIF) fixation of hip. FINDINGS: BONES AND JOINTS: SI joints are symmetric. Intramedullary rod and screw fixation across an intertrochanteric fracture of the left femur. The right hip demonstrates normal alignment. SOFT TISSUES: The soft tissues are unremarkable. IMPRESSION: 1. Intramedullary rod and screw fixation across an intertrochanteric fracture of the left femur. Electronically signed by: Norman Gatlin MD 09/19/2024 10:59 PM EST RP Workstation: [id number]   DG HIP UNILAT WITH PELVIS 2-3 VIEWS LEFT Result Date: 09/19/2024 CLINICAL DATA:  FT surgery.  Left femoral ORIF. EXAM: DG HIP (WITH OR WITHOUT PELVIS) 2-3V*L* COMPARISON:  Left hip radiograph dated 09/19/2024. FINDINGS: Intraoperative fluoroscopic spot images of the left hip provided. The total fluoroscopic time is 75.1 seconds with cumulative air kerma of 33.34 mGy. Status post left femoral ORIF. IMPRESSION: Intraoperative fluoroscopic spot images of the left hip. Electronically Signed   By: Vanetta Chou M.D.   On: 09/19/2024 14:09   DG C-Arm 1-60 Min-No Report Result Date: 09/19/2024 Fluoroscopy was utilized by the requesting physician.  No radiographic interpretation.   DG FEMUR MIN 2 VIEWS LEFT Result Date: 09/19/2024 CLINICAL DATA:  Known left hip fracture EXAM: LEFT FEMUR 2 VIEWS COMPARISON:  09/18/2024 FINDINGS: Left intratrochanteric fracture is again identified with mild impaction and angulation at the fracture site. Degenerative changes about the knee joint are seen. No soft tissue abnormality is noted. IMPRESSION: Proximal left femoral fracture similar to that seen on prior exam. Electronically Signed   By: Oneil Devonshire M.D.   On:  09/19/2024 02:05   DG Chest Portable 1 View Result Date: 09/18/2024 CLINICAL DATA:  Hip fracture EXAM: PORTABLE CHEST 1 VIEW COMPARISON:  09/22/2023 FINDINGS: Low lung volumes. Mild cardiomegaly with central bronchovascular crowding. No acute airspace disease, pleural effusion or pneumothorax. Aortic atherosclerosis IMPRESSION: Low lung volumes with mild cardiomegaly. Electronically Signed   By: Luke Bun M.D.   On: 09/18/2024 20:00   DG Hip Unilat W or Wo Pelvis 2-3 Views Left Result Date: 09/18/2024 CLINICAL DATA:  Hip pain after fall. EXAM: DG HIP (WITH OR WITHOUT PELVIS) 2-3V LEFT COMPARISON:  None Available. FINDINGS: Comminuted and displaced intertrochanteric left proximal femur fracture. There is displacement involving the lesser and greater trochanter. No hip dislocation. Mild bilateral hip osteoarthritis. Intact bony pelvis. No pubic symphyseal or sacroiliac diastasis. IMPRESSION: Comminuted and displaced intertrochanteric left proximal femur fracture. Electronically Signed   By: Andrea Gasman M.D.   On: 09/18/2024 18:55    Time coordinating discharge: 40 mins  SIGNED:  Norval Bar, MD [facility] 09/28/2024, 1:21 PM     [1] No Known Allergies  "

## 2024-09-28 NOTE — Plan of Care (Signed)
   Problem: Coping: Goal: Ability to adjust to condition or change in health will improve Outcome: Progressing   Problem: Fluid Volume: Goal: Ability to maintain a balanced intake and output will improve Outcome: Progressing   Problem: Metabolic: Goal: Ability to maintain appropriate glucose levels will improve Outcome: Progressing   Problem: Nutritional: Goal: Maintenance of adequate nutrition will improve Outcome: Progressing

## 2024-10-05 ENCOUNTER — Encounter: Admitting: Physician Assistant

## 2024-10-05 ENCOUNTER — Encounter: Payer: Self-pay | Admitting: Internal Medicine

## 2024-10-08 ENCOUNTER — Other Ambulatory Visit (INDEPENDENT_AMBULATORY_CARE_PROVIDER_SITE_OTHER)

## 2024-10-08 ENCOUNTER — Ambulatory Visit (INDEPENDENT_AMBULATORY_CARE_PROVIDER_SITE_OTHER): Admitting: Physician Assistant

## 2024-10-08 DIAGNOSIS — M25552 Pain in left hip: Secondary | ICD-10-CM

## 2024-10-08 DIAGNOSIS — S72142A Displaced intertrochanteric fracture of left femur, initial encounter for closed fracture: Secondary | ICD-10-CM

## 2024-10-08 NOTE — Addendum Note (Signed)
 Addended by: Sudais Banghart on: 10/08/2024 10:28 AM   Modules accepted: Orders

## 2024-10-08 NOTE — Progress Notes (Addendum)
" ° °  Post-Op Visit Note   Patient: Jose Summers           Date of Birth: September 26, 1961           MRN: [id number] Visit Date: 10/08/2024 PCP: Duanne Butler DASEN, MD   Assessment & Plan:  Chief Complaint:  Chief Complaint  Patient presents with   Left Hip - Routine Post Op, Pain    09/19/24-Fixation, Fracture, Intertrochanteric, With Intramedullary Rod - Left     Visit Diagnoses:  1. Pain in left hip   2. Displaced intertrochanteric fracture of left femur, initial encounter for closed fracture [facility])     Plan: Patient is a pleasant 64 year old gentleman who comes in today 3 weeks status post left hip IM nail from intertrochanteric femur fracture.  He has been at [facility] getting physical therapy where he has been ambulating with a walker.  Examination of his left hip reveals well-healed surgical incisions with staples intact.  He does have a small seroma to the most proximal incision.  Calves are soft nontender.  He is neurovascular intact distally.  Today, the staples were removed and Steri-Strips applied.  Continue with physical therapy weightbearing as tolerated to the operative extremity.  We have discussed referral to our osteoporosis clinic.  Follow-up in 3 weeks for repeat evaluation and x-rays of the left hip.  Call with concerns or questions.  Follow-Up Instructions: Return in about 3 weeks (around 10/29/2024).   Orders:  Orders Placed This Encounter  Procedures   XR HIP UNILAT W OR W/O PELVIS 2-3 VIEWS LEFT   No orders of the defined types were placed in this encounter.   Imaging: XR HIP UNILAT W OR W/O PELVIS 2-3 VIEWS LEFT Result Date: 10/08/2024 X-rays demonstrate stable alignment of the fracture without interval change.  No hardware complication.   PMFS History: Patient Active Problem List   Diagnosis Date Noted   Displaced intertrochanteric fracture of left femur, initial encounter for closed fracture (HCC) 09/18/2024   Type 2 diabetes mellitus with  hypoglycemia (HCC) 09/23/2023   Hypomagnesemia 09/23/2023   Hyponatremia 09/23/2023   Essential hypertension 09/23/2023   Mixed hyperlipidemia 09/23/2023   Obesity (BMI 30-39.9) 09/23/2023   Failure to thrive in adult 09/23/2023   Past Medical History:  Diagnosis Date   Diabetes mellitus without complication (HCC)    Hyperlipidemia    Hypertension    Psoriasis     No family history on file.  Past Surgical History:  Procedure Laterality Date   INTRAMEDULLARY (IM) NAIL INTERTROCHANTERIC Left 09/19/2024   Procedure: FIXATION, FRACTURE, INTERTROCHANTERIC, WITH INTRAMEDULLARY ROD;  Surgeon: Jerri Kay HERO, MD;  Location: [facility] OR;  Service: Orthopedics;  Laterality: Left;   KNEE SURGERY Right    Social History   Occupational History   Not on file  Tobacco Use   Smoking status: Never    Passive exposure: Never   Smokeless tobacco: Never  Vaping Use   Vaping status: Never Used  Substance and Sexual Activity   Alcohol use: Not Currently   Drug use: Not Currently   Sexual activity: Not on file     "

## 2024-10-15 ENCOUNTER — Encounter: Admitting: Physician Assistant

## 2024-10-29 ENCOUNTER — Ambulatory Visit: Admitting: Physician Assistant

## 2024-10-29 ENCOUNTER — Other Ambulatory Visit (INDEPENDENT_AMBULATORY_CARE_PROVIDER_SITE_OTHER): Payer: Self-pay

## 2024-10-29 DIAGNOSIS — S72142A Displaced intertrochanteric fracture of left femur, initial encounter for closed fracture: Secondary | ICD-10-CM

## 2024-10-29 DIAGNOSIS — M25552 Pain in left hip: Secondary | ICD-10-CM

## 2024-10-29 DIAGNOSIS — M1712 Unilateral primary osteoarthritis, left knee: Secondary | ICD-10-CM

## 2024-10-29 MED ORDER — LIDOCAINE HCL 1 % IJ SOLN
2.0000 mL | INTRAMUSCULAR | Status: AC | PRN
  Administered 2024-10-29: 2 mL

## 2024-10-29 MED ORDER — METHYLPREDNISOLONE ACETATE 40 MG/ML IJ SUSP
40.0000 mg | INTRAMUSCULAR | Status: AC | PRN
  Administered 2024-10-29: 40 mg via INTRA_ARTICULAR

## 2024-10-29 MED ORDER — BUPIVACAINE HCL 0.25 % IJ SOLN
2.0000 mL | INTRAMUSCULAR | Status: AC | PRN
  Administered 2024-10-29: 2 mL via INTRA_ARTICULAR

## 2024-10-29 NOTE — Progress Notes (Signed)
 "  Post-Op Visit Note   Patient: Jose Summers           Date of Birth: [birth year]           MRN: [id number] Visit Date: 10/29/2024 PCP: Duanne Butler DASEN, MD  Procedure Note  Patient: Jose Summers             Date of Birth: 09/06/1961           MRN: [id number]             Visit Date: 10/29/2024  Procedures: Visit Diagnoses:  1. Displaced intertrochanteric fracture of left femur, initial encounter for closed fracture (HCC)   2. Pain in left hip   3. Unilateral primary osteoarthritis, left knee     Large Joint Inj: L knee on 10/29/2024 9:59 AM Medications: 2 mL lidocaine  1 %; 2 mL bupivacaine  0.25 %; 40 mg methylPREDNISolone  acetate 40 MG/ML         Assessment & Plan:  Chief Complaint:  Chief Complaint  Patient presents with   Left Hip - Follow-up    09/19/24-Fixation, Fracture, Intertrochanteric, With Intramedullary Rod - Left   Visit Diagnoses:  1. Displaced intertrochanteric fracture of left femur, initial encounter for closed fracture (HCC)   2. Pain in left hip   3. Unilateral primary osteoarthritis, left knee     Plan: Patient is a pleasant 64 year old gentleman who comes in today 6 weeks status post left hip IM nail for intertrochanteric femur fracture.  He has been doing relatively well in regards to the left hip but has noticed increased pain to the left knee.  He does have a history of underlying left knee osteoarthritis which was bothering him prior to surgery.  Unfortunately his left knee pain has worsened since his left hip surgery.  He tells me he has undergone cortisone injections to the left knee in the past with good relief.  Examination of the left hip reveals no pain with hip flexion.  He has mild discomfort with logroll.  Left knee exam: Small effusion.  Range of motion 10 to 95 degrees.  Medial joint line tenderness.  He is neurovascular intact distally.  Regards to the left hip, he is doing well.  Continue to advance activity.   Follow-up in 6 weeks for repeat evaluation and AP pelvis x-rays.  Regards to the left knee, he does have significant arthritis seen on x-ray today.  He would like to proceed with cortisone injection.  Follow-up as needed for his left knee.  Call with concerns.  Follow-Up Instructions: Return in about 6 weeks (around 12/10/2024).   Orders:  Orders Placed This Encounter  Procedures   Large Joint Inj   XR HIP UNILAT W OR W/O PELVIS 2-3 VIEWS LEFT   XR KNEE 3 VIEW LEFT   No orders of the defined types were placed in this encounter.   Imaging: XR KNEE 3 VIEW LEFT Result Date: 10/29/2024 Bone-on-bone in all 3 compartments  XR HIP UNILAT W OR W/O PELVIS 2-3 VIEWS LEFT Result Date: 10/29/2024 X-rays demonstrate stable fracture with evidence of callus formation.  No hardware complication.   PMFS History: Patient Active Problem List   Diagnosis Date Noted   Displaced intertrochanteric fracture of left femur, initial encounter for closed fracture (HCC) 09/18/2024   Type 2 diabetes mellitus with hypoglycemia (HCC) 09/23/2023   Hypomagnesemia 09/23/2023   Hyponatremia 09/23/2023   Essential hypertension 09/23/2023   Mixed hyperlipidemia 09/23/2023   Obesity (BMI 30-39.9) 09/23/2023   Failure  to thrive in adult 09/23/2023   Past Medical History:  Diagnosis Date   Diabetes mellitus without complication (HCC)    Hyperlipidemia    Hypertension    Psoriasis     No family history on file.  Past Surgical History:  Procedure Laterality Date   INTRAMEDULLARY (IM) NAIL INTERTROCHANTERIC Left 09/19/2024   Procedure: FIXATION, FRACTURE, INTERTROCHANTERIC, WITH INTRAMEDULLARY ROD;  Surgeon: Jerri Kay HERO, MD;  Location: [facility] OR;  Service: Orthopedics;  Laterality: Left;   KNEE SURGERY Right    Social History   Occupational History   Not on file  Tobacco Use   Smoking status: Never    Passive exposure: Never   Smokeless tobacco: Never  Vaping Use   Vaping status: Never Used  Substance and  Sexual Activity   Alcohol use: Not Currently   Drug use: Not Currently   Sexual activity: Not on file     "

## 2024-11-02 ENCOUNTER — Encounter: Admitting: Physician Assistant

## 2024-11-05 ENCOUNTER — Encounter: Admitting: Physician Assistant

## 2024-12-10 ENCOUNTER — Encounter: Admitting: Physician Assistant
